# Patient Record
Sex: Male | Born: 1960
Health system: Southern US, Community
[De-identification: ages and names within clinical notes are randomized; demographics above are authoritative.]

## PROBLEM LIST (undated history)

## (undated) DIAGNOSIS — N400 Enlarged prostate without lower urinary tract symptoms: Secondary | ICD-10-CM

## (undated) DIAGNOSIS — G4730 Sleep apnea, unspecified: Secondary | ICD-10-CM

## (undated) DIAGNOSIS — Z87442 Personal history of urinary calculi: Secondary | ICD-10-CM

## (undated) DIAGNOSIS — E785 Hyperlipidemia, unspecified: Secondary | ICD-10-CM

## (undated) DIAGNOSIS — A048 Other specified bacterial intestinal infections: Secondary | ICD-10-CM

## (undated) DIAGNOSIS — I10 Essential (primary) hypertension: Secondary | ICD-10-CM

## (undated) HISTORY — DX: Benign prostatic hyperplasia without lower urinary tract symptoms: N40.0

## (undated) HISTORY — PX: OTHER SURGICAL HISTORY: SHX169

## (undated) HISTORY — DX: Other specified bacterial intestinal infections: A04.8

## (undated) HISTORY — DX: Hyperlipidemia, unspecified: E78.5

## (undated) HISTORY — DX: Essential (primary) hypertension: I10

---

## 2002-08-25 HISTORY — PX: OTHER SURGICAL HISTORY: SHX169

## 2005-07-07 ENCOUNTER — Ambulatory Visit: Payer: Self-pay | Admitting: Family Medicine

## 2009-09-18 ENCOUNTER — Emergency Department (HOSPITAL_COMMUNITY): Admission: EM | Admit: 2009-09-18 | Discharge: 2009-09-18 | Payer: Self-pay | Admitting: Emergency Medicine

## 2010-11-11 LAB — BASIC METABOLIC PANEL
BUN: 11 mg/dL (ref 6–23)
CO2: 31 mEq/L (ref 19–32)
Calcium: 10.4 mg/dL (ref 8.4–10.5)
GFR calc non Af Amer: >60 mL/min (ref >60)
Glucose, Bld: 501 mg/dL — ABNORMAL HIGH (ref 70–99)
Sodium: 131 mEq/L — ABNORMAL LOW (ref 135–145)

## 2010-11-11 LAB — DIFFERENTIAL
Lymphs Abs: 2 10*3/uL (ref 0.7–4.0)
Monocytes Absolute: 0.5 10*3/uL (ref 0.1–1.0)
Monocytes Relative: 8 % (ref 3–12)
Neutro Abs: 3.1 10*3/uL (ref 1.7–7.7)
Neutrophils Relative %: 54 % (ref 43–77)

## 2010-11-11 LAB — HEPATIC FUNCTION PANEL
Alkaline Phosphatase: 118 U/L — ABNORMAL HIGH (ref 39–117)
Indirect Bilirubin: 0.7 mg/dL (ref 0.3–0.9)
Total Bilirubin: 0.8 mg/dL (ref 0.3–1.2)
Total Protein: 6.7 g/dL (ref 6.0–8.3)

## 2010-11-11 LAB — CBC
MCHC: 33.3 g/dL (ref 30.0–36.0)
RBC: 5.04 MIL/uL (ref 4.22–5.81)
WBC: 5.9 10*3/uL (ref 4.0–10.5)

## 2010-11-11 LAB — GLUCOSE, CAPILLARY

## 2010-11-11 LAB — POCT CARDIAC MARKERS: Myoglobin, poc: 74.3 ng/mL (ref 12–200)

## 2011-11-10 ENCOUNTER — Ambulatory Visit: Payer: Self-pay | Admitting: Family Medicine

## 2011-11-11 ENCOUNTER — Encounter: Payer: Self-pay | Admitting: Family Medicine

## 2011-11-11 ENCOUNTER — Ambulatory Visit (INDEPENDENT_AMBULATORY_CARE_PROVIDER_SITE_OTHER): Payer: BC Managed Care – PPO | Admitting: Family Medicine

## 2011-11-11 VITALS — BP 130/90 | HR 83 | Resp 16 | Ht 68.0 in | Wt 236.0 lb

## 2011-11-11 DIAGNOSIS — R3911 Hesitancy of micturition: Secondary | ICD-10-CM

## 2011-11-11 DIAGNOSIS — E785 Hyperlipidemia, unspecified: Secondary | ICD-10-CM | POA: Insufficient documentation

## 2011-11-11 DIAGNOSIS — E669 Obesity, unspecified: Secondary | ICD-10-CM

## 2011-11-11 DIAGNOSIS — E1165 Type 2 diabetes mellitus with hyperglycemia: Secondary | ICD-10-CM | POA: Insufficient documentation

## 2011-11-11 DIAGNOSIS — E119 Type 2 diabetes mellitus without complications: Secondary | ICD-10-CM

## 2011-11-11 DIAGNOSIS — B351 Tinea unguium: Secondary | ICD-10-CM

## 2011-11-11 DIAGNOSIS — Z125 Encounter for screening for malignant neoplasm of prostate: Secondary | ICD-10-CM

## 2011-11-11 LAB — LIPID PANEL
HDL: 29 mg/dL — ABNORMAL LOW (ref >39)
LDL Cholesterol: 143 mg/dL — ABNORMAL HIGH (ref 0–99)
Total CHOL/HDL Ratio: 7 Ratio
Triglycerides: 153 mg/dL — ABNORMAL HIGH (ref <150)
VLDL: 31 mg/dL (ref 0–40)

## 2011-11-11 LAB — GLUCOSE, POCT (MANUAL RESULT ENTRY): POC Glucose: 277

## 2011-11-11 LAB — COMPREHENSIVE METABOLIC PANEL
ALT: 29 U/L (ref 0–53)
AST: 17 U/L (ref 0–37)
Albumin: 4 g/dL (ref 3.5–5.2)
BUN: 11 mg/dL (ref 6–23)
Calcium: 10.6 mg/dL — ABNORMAL HIGH (ref 8.4–10.5)
Chloride: 99 mEq/L (ref 96–112)
Potassium: 4.6 mEq/L (ref 3.5–5.3)
Sodium: 133 mEq/L — ABNORMAL LOW (ref 135–145)
Total Protein: 6.9 g/dL (ref 6.0–8.3)

## 2011-11-11 LAB — CBC
Hemoglobin: 14.7 g/dL (ref 13.0–17.0)
MCH: 28.4 pg (ref 26.0–34.0)
MCHC: 32.4 g/dL (ref 30.0–36.0)
Platelets: 211 10*3/uL (ref 150–400)
RDW: 13 % (ref 11.5–15.5)

## 2011-11-11 LAB — HEMOGLOBIN A1C: Mean Plasma Glucose: 318 mg/dL — ABNORMAL HIGH (ref <117)

## 2011-11-11 NOTE — Patient Instructions (Addendum)
Get the labs done, we will call with results and how much diabetes medication you need  For your diabetes your goal for blood pressure is top number less than 130 and bottom number less than 80 Your "bad" cholesterol or LDL goal is less than 100  Test your blood sugar fasting  And record  I will start medications depending on your lab results It is important to watch your intake on the road, avoid french fries, hamburgers, fried foods, soda, sweet tea  F/U 1 month

## 2011-11-11 NOTE — Progress Notes (Signed)
  Subjective:    [name], male    DOB: 26-Apr-1961, 51 y.o.   MRN: [id number]  HPI  Pt here to establish care previous PCP [facility]  DM- history of DM, he was on Metformin but was not taking care of himself properly, he recently restarted expired Metforim 500mg  tablets he found at home. He was on both Metformin and Glipizide in the past.    Hyperlipidemia- no current medications, told it was high in the past  Truck driver    Urinary hesistancy-He has difficulty with his urine stream at times, he feels the need to go and dribbles at time, others he does not empy his bladder all the way, denies dysuria   Nail fungus- he did not complete the course of medications for his nails and is asking for this to be filled  Review of Systems  GEN- denies fatigue, fever, weight loss,weakness, recent illness HEENT- denies eye drainage, change in vision, nasal discharge, CVS- denies chest pain, palpitations RESP- denies SOB, cough, wheeze ABD- denies N/V, change in stools, abd pain GU- denies dysuria, hematuria,+ dribbling, incontinence MSK- denies joint pain, muscle aches, injury Neuro- denies headache, dizziness, syncope, seizure activity       Objective:   Physical Exam GEN- NAD, alert and oriented x3 HEENT- PERRL, EOMI, non injected sclera, pink conjunctiva, MMM, oropharynx clear- poor dentition Neck- Supple, no bruit CVS- RRR, no murmur RESP-CTAB ABD-NABS,soft, NT,ND EXT- trace left pedal edema Pulses- Radial, DP- 2+ Nails- +fungus  bilateral great toes and 1st digits, thick nails       Assessment & Plan:

## 2011-11-12 MED ORDER — METFORMIN HCL 1000 MG PO TABS: 1000.0000 mg | ORAL_TABLET | Freq: Two times a day (BID) | ORAL | Status: DC

## 2011-11-12 MED ORDER — TAMSULOSIN HCL 0.4 MG PO CAPS: 0.4000 mg | ORAL_CAPSULE | Freq: Every day | ORAL | Status: DC

## 2011-11-12 MED ORDER — TERBINAFINE HCL 250 MG PO TABS: 250.0000 mg | ORAL_TABLET | Freq: Every day | ORAL | Status: AC

## 2011-11-12 NOTE — Assessment & Plan Note (Signed)
Will give coures of lamisil

## 2011-11-12 NOTE — Assessment & Plan Note (Addendum)
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 He was given diabetic food manuals for the road at the visit.  Will restart Metformin and titrate up to 1000mg  bid  Will start low dose ACEI at next visit

## 2011-11-12 NOTE — Assessment & Plan Note (Signed)
FLP checked and also severely elevated, will start DM treatment then get him on medications for cholesterol

## 2011-11-12 NOTE — Assessment & Plan Note (Signed)
Check PSA, will give trial of flomax

## 2011-12-15 ENCOUNTER — Other Ambulatory Visit: Payer: Self-pay | Admitting: Family Medicine

## 2011-12-15 ENCOUNTER — Encounter: Payer: Self-pay | Admitting: Family Medicine

## 2011-12-15 ENCOUNTER — Ambulatory Visit (INDEPENDENT_AMBULATORY_CARE_PROVIDER_SITE_OTHER): Payer: BC Managed Care – PPO | Admitting: Family Medicine

## 2011-12-15 VITALS — BP 148/72 | HR 90 | Resp 18 | Ht 68.0 in | Wt 234.1 lb

## 2011-12-15 DIAGNOSIS — Z1211 Encounter for screening for malignant neoplasm of colon: Secondary | ICD-10-CM

## 2011-12-15 DIAGNOSIS — I10 Essential (primary) hypertension: Secondary | ICD-10-CM

## 2011-12-15 DIAGNOSIS — E119 Type 2 diabetes mellitus without complications: Secondary | ICD-10-CM

## 2011-12-15 DIAGNOSIS — R3911 Hesitancy of micturition: Secondary | ICD-10-CM

## 2011-12-15 DIAGNOSIS — E785 Hyperlipidemia, unspecified: Secondary | ICD-10-CM

## 2011-12-15 DIAGNOSIS — K5900 Constipation, unspecified: Secondary | ICD-10-CM

## 2011-12-15 MED ORDER — GLIPIZIDE 5 MG PO TABS: 5.0000 mg | ORAL_TABLET | Freq: Two times a day (BID) | ORAL | Status: DC

## 2011-12-15 MED ORDER — LISINOPRIL 10 MG PO TABS: 10.0000 mg | ORAL_TABLET | Freq: Every day | ORAL | Status: DC

## 2011-12-15 NOTE — Patient Instructions (Signed)
Hold the flomax pill x 1 week, if the bowels become constipated again then I will change it  Stool softener twice a day  Take the blood pressure pill once a day  I will refer you to gastroenterology  I will refer you to a nutritionist  F/U 3 months

## 2011-12-16 ENCOUNTER — Encounter: Payer: Self-pay | Admitting: Family Medicine

## 2011-12-16 DIAGNOSIS — K5900 Constipation, unspecified: Secondary | ICD-10-CM | POA: Insufficient documentation

## 2011-12-16 DIAGNOSIS — I10 Essential (primary) hypertension: Secondary | ICD-10-CM | POA: Insufficient documentation

## 2011-12-16 NOTE — Progress Notes (Signed)
  Subjective:    [name], male    DOB: 08/30/1960, 51 y.o.   MRN: [id number]  HPI  Patient here to followup diabetes and medications. He is out of test strips. His fasting have been 200-300. He's now taking metformin one tablet twice a day. Constipation-he has history of constipation in the past. He fell after starting the prostate pill that it may have gotten worse although it did help his prostate symptoms a lot. He denies any diarrhea. Denies any recent blood in the stool but has been straining. Labs reviewed   Review of Systems    GEN- denies fatigue, fever, weight loss,weakness, recent illness HEENT- denies eye drainage, change in vision, nasal discharge, CVS- denies chest pain, palpitations RESP- denies SOB, cough, wheeze ABD- denies N/V, +change in stools, abd pain GU- denies dysuria, hematuria,+ dribbling, incontinence MSK- denies joint pain, muscle aches, injury Neuro- denies headache, dizziness, syncope, seizure activity    Objective:   Physical Exam GEN- NAD, alert and oriented x3 CVS- RRR, no murmur RESP-CTAB ABD-NABS,soft, NT,ND EXT- trace left pedal edema Rectum-soft, brown stool, FOBT neg, small internal hemorroid palpated no prolapse Pulses- Radial, DP- 2+        Assessment & Plan:

## 2011-12-16 NOTE — Assessment & Plan Note (Signed)
Fasting blood sugars still quite elevated. We'll add glipizide twice a day

## 2011-12-16 NOTE — Assessment & Plan Note (Signed)
Start lisinopril daily

## 2011-12-16 NOTE — Assessment & Plan Note (Signed)
Will start him on stool softeners twice a day. I will also refer him for colonoscopy I believe he has internal hemorrhoids.

## 2011-12-16 NOTE — Assessment & Plan Note (Signed)
PSA wnl, will hold on meds at this tme to see if contributing to constipation if so will change to Alpha blocker

## 2011-12-16 NOTE — Assessment & Plan Note (Signed)
LDL above goal. Patient to work on diet I will refer him to nutritionist. We will concentrate on the diabetes and hypertension first

## 2011-12-18 ENCOUNTER — Telehealth (HOSPITAL_COMMUNITY): Payer: Self-pay | Admitting: Dietician

## 2011-12-18 NOTE — Telephone Encounter (Signed)
Received referral from Dr. Jeanice Lim for dx: diabetes obesity on 12/16/11.

## 2011-12-18 NOTE — Telephone Encounter (Signed)
Sent letter to pt home via US Mail in attempt to contact pt to schedule appointment.  

## 2011-12-19 ENCOUNTER — Encounter: Payer: Self-pay | Admitting: Urgent Care

## 2011-12-19 ENCOUNTER — Ambulatory Visit (INDEPENDENT_AMBULATORY_CARE_PROVIDER_SITE_OTHER): Payer: BC Managed Care – PPO | Admitting: Urgent Care

## 2011-12-19 DIAGNOSIS — K219 Gastro-esophageal reflux disease without esophagitis: Secondary | ICD-10-CM | POA: Insufficient documentation

## 2011-12-19 DIAGNOSIS — K5900 Constipation, unspecified: Secondary | ICD-10-CM

## 2011-12-19 MED ORDER — OMEPRAZOLE 20 MG PO CPDR: 20.0000 mg | DELAYED_RELEASE_CAPSULE | Freq: Every day | ORAL | Status: DC

## 2011-12-19 NOTE — Patient Instructions (Addendum)
You need a colonoscopy and EGD (upper endoscopy) with Dr. Jena Gauss Begin omeprazole 20 mg daily 30 minutes before breakfast Begin MiraLax 17 g daily as needed for constipation Hold Glucotrol night before and morning of your procedure Hold metformin morning of your procedure Constipation in Adults Constipation is having fewer than 2 bowel movements per week. Usually, the stools are hard. As we grow older, constipation is more common. If you try to fix constipation with laxatives, the problem may get worse. This is because laxatives taken over a long period of time make the colon muscles weaker. A low-fiber diet, not taking in enough fluids, and taking some medicines may make these problems worse. MEDICATIONS THAT MAY CAUSE CONSTIPATION  Water pills (diuretics).   Calcium channel blockers (used to control blood pressure and for the heart).   Certain pain medicines (narcotics).   Anticholinergics.   Anti-inflammatory agents.   Antacids that contain aluminum.  DISEASES THAT CONTRIBUTE TO CONSTIPATION  Diabetes.   Parkinson's disease.   Dementia.   Stroke.   Depression.   Illnesses that cause problems with salt and water metabolism.  HOME CARE INSTRUCTIONS   Constipation is usually best cared for without medicines. Increasing dietary fiber and eating more fruits and vegetables is the best way to manage constipation.   Slowly increase fiber intake to 25 to 38 grams per day. Whole grains, fruits, vegetables, and legumes are good sources of fiber. A dietitian can further help you incorporate high-fiber foods into your diet.   Drink enough water and fluids to keep your urine clear or pale yellow.   A fiber supplement may be added to your diet if you cannot get enough fiber from foods.   Increasing your activities also helps improve regularity.   Suppositories, as suggested by your caregiver, will also help. If you are using antacids, such as aluminum or calcium containing products,  it will be helpful to switch to products containing magnesium if your caregiver says it is okay.   If you have been given a liquid injection (enema) today, this is only a temporary measure. It should not be relied on for treatment of longstanding (chronic) constipation.   Stronger measures, such as magnesium sulfate, should be avoided if possible. This may cause uncontrollable diarrhea. Using magnesium sulfate may not allow you time to make it to the bathroom.  SEEK IMMEDIATE MEDICAL CARE IF:   There is bright red blood in the stool.   The constipation stays for more than 4 days.   There is belly (abdominal) or rectal pain.   You do not seem to be getting better.   You have any questions or concerns.  MAKE SURE YOU:   Understand these instructions.   Will watch your condition.   Will get help right away if you are not doing well or get worse.  Document Released: 05/09/2004 Document Revised: 07/31/2011 Document Reviewed: 07/15/2011 [facility] Patient Information 2012 [place], [place]. Diet for GERD or PUD Nutrition therapy can help ease the discomfort of gastroesophageal reflux disease (GERD) and peptic ulcer disease (PUD).  HOME CARE INSTRUCTIONS   Eat your meals slowly, in a relaxed setting.   Eat 5 to 6 small meals per day.   If a food causes distress, stop eating it for a period of time.  FOODS TO AVOID  Coffee, regular or decaffeinated.   Cola beverages, regular or low calorie.   Tea, regular or decaffeinated.   Pepper.   Cocoa.   High fat foods, including meats.  Butter, margarine, hydrogenated oil (trans fats).   Peppermint or spearmint (if you have GERD).   Fruits and vegetables if not tolerated.   Alcohol.   Nicotine (smoking or chewing). This is one of the most potent stimulants to acid production in the gastrointestinal tract.   Any food that seems to aggravate your condition.  If you have questions regarding your diet, ask your caregiver or a  registered dietitian. TIPS  Lying flat may make symptoms worse. Keep the head of your bed raised 6 to 9 inches (15 to 23 cm) by using a foam wedge or blocks under the legs of the bed.   Do not lay down until 3 hours after eating a meal.   Daily physical activity may help reduce symptoms.  MAKE SURE YOU:   Understand these instructions.   Will watch your condition.   Will get help right away if you are not doing well or get worse.  Document Released: 08/11/2005 Document Revised: 07/31/2011 Document Reviewed: 06/27/2011 [facility] Patient Information 2012 [place], [place].

## 2011-12-19 NOTE — Progress Notes (Signed)
Referring Provider: Salley Scarlet, MD Primary Care Physician:  Milinda Antis, MD, MD Primary Gastroenterologist:  Dr. Jena Gauss  Chief Complaint  Patient presents with  . Colonoscopy  . Constipation    HPI:  Scott Perez is a 51 y.o. male here as a referral from Dr. Jeanice Lim for screening colonoscopy. Upon further questioning for triage, it was noted that he has been having problems with constipation and acid reflux. He was brought into the office to discuss this further prior to his procedure. He began to have constipation that started briefly after he started taking  flomax.  Rarely had constipation until started medicine 3 weeks ago.  Denies rectal bleeding or melena.  Weight stable.  Appetite ok.  C/o heartburn & indigestion daily 6 months.  He has not tried any medications yet.  Denies dysphagia or odynophagia.  His acid reflux is worse 1st thing in morning & late at night.  Occasionally awakens him from sleeping.     Past Medical History  Diagnosis Date  . Diabetes mellitus   . Hyperlipidemia   . Hypertension   . BPH (benign prostatic hypertrophy)    Past Surgical History  Procedure Date  . Left foot 2004  . Cyst left wrist     Current Outpatient Prescriptions  Medication Sig Dispense Refill  . glipiZIDE (GLUCOTROL) 5 MG tablet Take 1 tablet (5 mg total) by mouth 2 (two) times daily before a meal.  60 tablet  3  . lisinopril (PRINIVIL,ZESTRIL) 10 MG tablet Take 1 tablet (10 mg total) by mouth daily.  30 tablet  3  . metFORMIN (GLUCOPHAGE) 1000 MG tablet Take 1 tablet (1,000 mg total) by mouth 2 (two) times daily with a meal.  60 tablet  3  . Tamsulosin HCl (FLOMAX) 0.4 MG CAPS Take 1 capsule (0.4 mg total) by mouth daily after breakfast.  30 capsule  3  . terbinafine (LAMISIL) 250 MG tablet Take 1 tablet (250 mg total) by mouth daily.  42 tablet  0    Allergies as of 12/19/2011  . (No Known Allergies)    Family History  Problem Relation Age of Onset  . Breast cancer  Mother   . Hypertension Father   . Colon polyps Brother   . Colon cancer Paternal Uncle     History   Social History  . Marital Status: Married    Spouse Name: N/A    Number of Children: 3  . Years of Education: N/A   Occupational History  . truck driver    Social History Main Topics  . Smoking status: Former Smoker -- 1.0 packs/day for 18 years    Types: Cigarettes    Quit date: 12/19/1995  . Smokeless tobacco: Not on file  . Alcohol Use: No  . Drug Use: No  . Sexually Active: Not on file  Review of Systems: Gen: Denies any fever, chills, sweats, anorexia, fatigue, weakness, malaise, weight loss, and sleep disorder. CV: Denies chest pain, angina, palpitations, syncope, orthopnea, PND, peripheral edema, and claudication. Resp: Denies dyspnea at rest, dyspnea with exercise, cough, sputum, wheezing, coughing up blood, and pleurisy. GI: Denies vomiting blood, jaundice, and fecal incontinence. GU : Denies urinary burning, blood in urine, urinary frequency, urinary hesitancy, nocturnal urination, and urinary incontinence. MS: Denies joint pain, limitation of movement, and swelling, stiffness, low back pain, extremity pain. Denies muscle weakness, cramps, atrophy.  Derm: Denies rash, itching, dry skin, hives, moles, warts, or unhealing ulcers.  Psych: Denies depression, anxiety, memory loss,  suicidal ideation, hallucinations, paranoia, and confusion. Heme: Denies bruising, bleeding, and enlarged lymph nodes. Neuro:  Denies any headaches, dizziness, paresthesias. Endo:  Denies any problems with DM, thyroid, adrenal function.  Physical Exam: BP 131/65  Pulse 66  Temp(Src) 97 F (36.1 C) (Temporal)  Ht 5\' 8"  (1.727 m)  Wt 231 lb 12.8 oz (105.144 kg)  BMI 35.25 kg/m2 General:   Alert,  Well-developed, well-nourished, pleasant and cooperative in NAD. Head:  Normocephalic and atraumatic. Eyes:  Sclera clear, no icterus.   Conjunctiva pink. Ears:  Normal auditory acuity. Nose:   No deformity, discharge, or lesions. Mouth:  No deformity or lesions,oropharynx pink & moist. Neck:  Supple; no masses or thyromegaly. Lungs:  Clear throughout to auscultation.   No wheezes, crackles, or rhonchi. No acute distress. Heart:  Regular rate and rhythm; no murmurs, clicks, rubs,  or gallops. Abdomen:  Normal bowel sounds.  No bruits.  Soft, non-tender and non-distended without masses, hepatosplenomegaly or hernias noted.  No guarding or rebound tenderness.   Rectal:  Deferred. Msk:  Symmetrical without gross deformities. Normal posture. Pulses:  Normal pulses noted. Extremities:  No clubbing or edema. Neurologic:  Alert and oriented x4;  grossly normal neurologically. Skin:  Intact without significant lesions or rashes. Lymph Nodes:  No significant cervical adenopathy. Psych:  Alert and cooperative. Normal mood and affect.

## 2011-12-19 NOTE — Assessment & Plan Note (Addendum)
HIMMAT ENBERG is a pleasant 51 y.o. male with constipation that started 3 weeks ago most likely secondary to new medication flomax.  He is due for surveillance colonoscopy.  I have discussed risks & benefits which include, but are not limited to, bleeding, infection, perforation & drug reaction.  The patient agrees with this plan & written consent will be obtained.    Begin MiraLax 17 g daily as needed for constipation Hold Glucotrol night before and morning of your procedure Hold metformin morning of your procedure Constipation literature

## 2011-12-19 NOTE — Progress Notes (Signed)
Faxed to PCP

## 2011-12-19 NOTE — Progress Notes (Signed)
Addended by: Cherene Julian D on: 12/19/2011 10:25 AM   Modules accepted: Orders

## 2011-12-19 NOTE — Assessment & Plan Note (Signed)
New onset GERD over the course of 6 months is worrisome given his age.  He will need further evaluation to rule out complicated GERD, malignancy, peptic ulcer disease, or gastritis.  I have discussed risks & benefits which include, but are not limited to, bleeding, infection, perforation & drug reaction.  The patient agrees with this plan & written consent will be obtained.    Begin omeprazole 20 mg daily 30 minutes before breakfast GERD literature

## 2011-12-24 ENCOUNTER — Telehealth: Payer: Self-pay

## 2011-12-24 DIAGNOSIS — A048 Other specified bacterial intestinal infections: Secondary | ICD-10-CM

## 2011-12-24 HISTORY — DX: Other specified bacterial intestinal infections: A04.8

## 2011-12-24 NOTE — Telephone Encounter (Signed)
Pt called. 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 York Spaniel he needs to change date of colonoscopy/egd from 12/29/2011 to 12/30/2011. I changed on our schedule and with Jules Schick in Endo. Pt is scheduled for 1:45 PM on 12/30/2011 with [facility], and is aware that he should be at the hospital at 12:45 PM.

## 2011-12-24 NOTE — Telephone Encounter (Signed)
Sent letter to pt home via US Mail in attempt to contact pt to schedule appointment.  

## 2011-12-29 ENCOUNTER — Telehealth: Payer: Self-pay | Admitting: Internal Medicine

## 2011-12-29 MED ORDER — SODIUM CHLORIDE 0.45 % IV SOLN
Freq: Once | INTRAVENOUS | Status: AC
  Administered 2012-01-05: 12:00:00 via INTRAVENOUS

## 2011-12-29 NOTE — Telephone Encounter (Signed)
Called pt back to reschedule- he will check his schedule & call back

## 2011-12-29 NOTE — Telephone Encounter (Signed)
Pt called to [facility] his procedure. Please call him back at [phone]

## 2012-01-01 ENCOUNTER — Encounter (HOSPITAL_COMMUNITY): Payer: Self-pay | Admitting: Pharmacy Technician

## 2012-01-01 NOTE — Telephone Encounter (Signed)
Sent letter to pt home via US Mail in attempt to contact pt to schedule appointment.  

## 2012-01-05 ENCOUNTER — Encounter (HOSPITAL_COMMUNITY)
Admission: RE | Disposition: A | Discharge status: Home Health | Payer: Self-pay | Source: Ambulatory Visit | Attending: Internal Medicine

## 2012-01-05 ENCOUNTER — Encounter (HOSPITAL_COMMUNITY): Payer: Self-pay | Admitting: *Deleted

## 2012-01-05 ENCOUNTER — Ambulatory Visit (HOSPITAL_COMMUNITY)
Admission: RE | Admit: 2012-01-05 | Discharge: 2012-01-05 | Disposition: A | Discharge status: Home Health | Payer: BC Managed Care – PPO | Source: Ambulatory Visit | Attending: Internal Medicine | Admitting: Internal Medicine

## 2012-01-05 DIAGNOSIS — K219 Gastro-esophageal reflux disease without esophagitis: Secondary | ICD-10-CM | POA: Insufficient documentation

## 2012-01-05 DIAGNOSIS — E785 Hyperlipidemia, unspecified: Secondary | ICD-10-CM | POA: Insufficient documentation

## 2012-01-05 DIAGNOSIS — E119 Type 2 diabetes mellitus without complications: Secondary | ICD-10-CM | POA: Insufficient documentation

## 2012-01-05 DIAGNOSIS — K5909 Other constipation: Secondary | ICD-10-CM

## 2012-01-05 DIAGNOSIS — D126 Benign neoplasm of colon, unspecified: Secondary | ICD-10-CM | POA: Insufficient documentation

## 2012-01-05 DIAGNOSIS — Z79899 Other long term (current) drug therapy: Secondary | ICD-10-CM | POA: Insufficient documentation

## 2012-01-05 DIAGNOSIS — I10 Essential (primary) hypertension: Secondary | ICD-10-CM | POA: Insufficient documentation

## 2012-01-05 DIAGNOSIS — Z1211 Encounter for screening for malignant neoplasm of colon: Secondary | ICD-10-CM | POA: Insufficient documentation

## 2012-01-05 DIAGNOSIS — K222 Esophageal obstruction: Secondary | ICD-10-CM | POA: Insufficient documentation

## 2012-01-05 DIAGNOSIS — Z01812 Encounter for preprocedural laboratory examination: Secondary | ICD-10-CM | POA: Insufficient documentation

## 2012-01-05 SURGERY — COLONOSCOPY WITH ESOPHAGOGASTRODUODENOSCOPY (EGD)
Anesthesia: Moderate Sedation

## 2012-01-05 MED ORDER — MIDAZOLAM HCL 5 MG/5ML IJ SOLN
INTRAMUSCULAR | Status: DC | PRN
  Administered 2012-01-05: 1 mg via INTRAVENOUS
  Administered 2012-01-05 (×2): 2 mg via INTRAVENOUS
  Administered 2012-01-05: 1 mg via INTRAVENOUS

## 2012-01-05 MED ORDER — BUTAMBEN-TETRACAINE-BENZOCAINE 2-2-14 % EX AERO
INHALATION_SPRAY | CUTANEOUS | Status: DC | PRN
  Administered 2012-01-05: 2 via TOPICAL

## 2012-01-05 MED ORDER — STERILE WATER FOR IRRIGATION IR SOLN
Status: DC | PRN
  Administered 2012-01-05: 13:00:00

## 2012-01-05 MED ORDER — MEPERIDINE HCL 100 MG/ML IJ SOLN
INTRAMUSCULAR | Status: DC | PRN
  Administered 2012-01-05: 25 mg via INTRAVENOUS
  Administered 2012-01-05: 50 mg via INTRAVENOUS
  Administered 2012-01-05: 25 mg via INTRAVENOUS

## 2012-01-05 MED ORDER — MEPERIDINE HCL 100 MG/ML IJ SOLN
INTRAMUSCULAR | Status: AC
  Filled 2012-01-05: qty 2

## 2012-01-05 MED ORDER — MIDAZOLAM HCL 5 MG/5ML IJ SOLN
INTRAMUSCULAR | Status: AC
  Filled 2012-01-05: qty 10

## 2012-01-05 NOTE — H&P (View-Only) (Signed)
Referring Provider: Mayes, Kawanta F, MD Primary Care Physician:  [place], KAWANTA, MD, MD Primary Gastroenterologist:  Dr. Rourk  Chief Complaint  Patient presents with  . Colonoscopy  . Constipation    HPI:  Scott Perez is a 51 y.o. male here as a referral from Dr. [place] for screening colonoscopy. Upon further questioning for triage, it was noted that he has been having problems with constipation and acid reflux. He was brought into the office to discuss this further prior to his procedure. He began to have constipation that started briefly after he started taking  flomax.  Rarely had constipation until started medicine 3 weeks ago.  Denies rectal bleeding or melena.  Weight stable.  Appetite ok.  C/o heartburn & indigestion daily 6 months.  He has not tried any medications yet.  Denies dysphagia or odynophagia.  His acid reflux is worse 1st thing in morning & late at night.  Occasionally awakens him from sleeping.     Past Medical History  Diagnosis Date  . Diabetes mellitus   . Hyperlipidemia   . Hypertension   . BPH (benign prostatic hypertrophy)    Past Surgical History  Procedure Date  . Left foot 2004  . Cyst left wrist     Current Outpatient Prescriptions  Medication Sig Dispense Refill  . glipiZIDE (GLUCOTROL) 5 MG tablet Take 1 tablet (5 mg total) by mouth 2 (two) times daily before a meal.  60 tablet  3  . lisinopril (PRINIVIL,ZESTRIL) 10 MG tablet Take 1 tablet (10 mg total) by mouth daily.  30 tablet  3  . metFORMIN (GLUCOPHAGE) 1000 MG tablet Take 1 tablet (1,000 mg total) by mouth 2 (two) times daily with a meal.  60 tablet  3  . Tamsulosin HCl (FLOMAX) 0.4 MG CAPS Take 1 capsule (0.4 mg total) by mouth daily after breakfast.  30 capsule  3  . terbinafine (LAMISIL) 250 MG tablet Take 1 tablet (250 mg total) by mouth daily.  42 tablet  0    Allergies as of 12/19/2011  . (No Known Allergies)    Family History  Problem Relation Age of Onset  . Breast cancer  Mother   . Hypertension Father   . Colon polyps Brother   . Colon cancer Paternal Uncle     History   Social History  . Marital Status: Married    Spouse Name: N/A    Number of Children: 3  . Years of Education: N/A   Occupational History  . truck driver    Social History Main Topics  . Smoking status: Former Smoker -- 1.0 packs/day for 18 years    Types: Cigarettes    Quit date: 12/19/1995  . Smokeless tobacco: Not on file  . Alcohol Use: No  . Drug Use: No  . Sexually Active: Not on file  Review of Systems: Gen: Denies any fever, chills, sweats, anorexia, fatigue, weakness, malaise, weight loss, and sleep disorder. CV: Denies chest pain, angina, palpitations, syncope, orthopnea, PND, peripheral edema, and claudication. Resp: Denies dyspnea at rest, dyspnea with exercise, cough, sputum, wheezing, coughing up blood, and pleurisy. GI: Denies vomiting blood, jaundice, and fecal incontinence. GU : Denies urinary burning, blood in urine, urinary frequency, urinary hesitancy, nocturnal urination, and urinary incontinence. MS: Denies joint pain, limitation of movement, and swelling, stiffness, low back pain, extremity pain. Denies muscle weakness, cramps, atrophy.  Derm: Denies rash, itching, dry skin, hives, moles, warts, or unhealing ulcers.  Psych: Denies depression, anxiety, memory loss,   suicidal ideation, hallucinations, paranoia, and confusion. Heme: Denies bruising, bleeding, and enlarged lymph nodes. Neuro:  Denies any headaches, dizziness, paresthesias. Endo:  Denies any problems with DM, thyroid, adrenal function.  Physical Exam: BP 131/65  Pulse 66  Temp(Src) 97 F (36.1 C) (Temporal)  Ht 5' 8" (1.727 m)  Wt 231 lb 12.8 oz (105.144 kg)  BMI 35.25 kg/m2 General:   Alert,  Well-developed, well-nourished, pleasant and cooperative in NAD. Head:  Normocephalic and atraumatic. Eyes:  Sclera clear, no icterus.   Conjunctiva pink. Ears:  Normal auditory acuity. Nose:   No deformity, discharge, or lesions. Mouth:  No deformity or lesions,oropharynx pink & moist. Neck:  Supple; no masses or thyromegaly. Lungs:  Clear throughout to auscultation.   No wheezes, crackles, or rhonchi. No acute distress. Heart:  Regular rate and rhythm; no murmurs, clicks, rubs,  or gallops. Abdomen:  Normal bowel sounds.  No bruits.  Soft, non-tender and non-distended without masses, hepatosplenomegaly or hernias noted.  No guarding or rebound tenderness.   Rectal:  Deferred. Msk:  Symmetrical without gross deformities. Normal posture. Pulses:  Normal pulses noted. Extremities:  No clubbing or edema. Neurologic:  Alert and oriented x4;  grossly normal neurologically. Skin:  Intact without significant lesions or rashes. Lymph Nodes:  No significant cervical adenopathy. Psych:  Alert and cooperative. Normal mood and affect.  

## 2012-01-05 NOTE — Discharge Instructions (Addendum)
Colonoscopy Discharge Instructions  Read the instructions outlined below and refer to this sheet in the next few weeks. These discharge instructions provide you with general information on caring for yourself after you leave the hospital. Your doctor may also give you specific instructions. While your treatment has been planned according to the most current medical practices available, unavoidable complications occasionally occur. If you have any problems or questions after discharge, call Dr. Gala Romney at [phone]. ACTIVITY  You may resume your regular activity, but move at a slower pace for the next 24 hours.   Take frequent rest periods for the next 24 hours.   Walking will help get rid of the air and reduce the bloated feeling in your belly (abdomen).   No driving for 24 hours (because of the medicine (anesthesia) used during the test).    Do not sign any important legal documents or operate any machinery for 24 hours (because of the anesthesia used during the test).  NUTRITION  Drink plenty of fluids.   You may resume your normal diet as instructed by your doctor.   Begin with a light meal and progress to your normal diet. Heavy or fried foods are harder to digest and may make you feel sick to your stomach (nauseated).   Avoid alcoholic beverages for 24 hours or as instructed.  MEDICATIONS  You may resume your normal medications unless your doctor tells you otherwise.  WHAT YOU CAN EXPECT TODAY  Some feelings of bloating in the abdomen.   Passage of more gas than usual.   Spotting of blood in your stool or on the toilet paper.  IF YOU HAD POLYPS REMOVED DURING THE COLONOSCOPY:  No aspirin products for 7 days or as instructed.   No alcohol for 7 days or as instructed.   Eat a soft diet for the next 24 hours.  FINDING OUT THE RESULTS OF YOUR TEST Not all test results are available during your visit. If your test results are not back during the visit, make an appointment  with your caregiver to find out the results. Do not assume everything is normal if you have not heard from your caregiver or the medical facility. It is important for you to follow up on all of your test results.  SEEK IMMEDIATE MEDICAL ATTENTION IF:  You have more than a spotting of blood in your stool.   Your belly is swollen (abdominal distention).   You are nauseated or vomiting.   You have a temperature over 101.  You have abdominal pain or discomfort that is severe or gets worse throughout the day. EGD Discharge instructions Please read the instructions outlined below and refer to this sheet in the next few weeks. These discharge instructions provide you with general information on caring for yourself after you leave the hospital. Your doctor may also give you specific instructions. While your treatment has been planned according to the most current medical practices available, unavoidable complications occasionally occur. If you have any problems or questions after discharge, please call your doctor. ACTIVITY You may resume your regular activity but move at a slower pace for the next 24 hours.  Take frequent rest periods for the next 24 hours.  Walking will help expel (get rid of) the air and reduce the bloated feeling in your abdomen.  No driving for 24 hours (because of the anesthesia (medicine) used during the test).  You may shower.  Do not sign any important legal documents or operate any machinery for 24  hours (because of the anesthesia used during the test).  NUTRITION Drink plenty of fluids.  You may resume your normal diet.  Begin with a light meal and progress to your normal diet.  Avoid alcoholic beverages for 24 hours or as instructed by your caregiver.  MEDICATIONS You may resume your normal medications unless your caregiver tells you otherwise.  WHAT YOU CAN EXPECT TODAY You may experience abdominal discomfort such as a feeling of fullness or "gas" pains.   FOLLOW-UP Your doctor will discuss the results of your test with you.  SEEK IMMEDIATE MEDICAL ATTENTION IF ANY OF THE FOLLOWING OCCUR: Excessive nausea (feeling sick to your stomach) and/or vomiting.  Severe abdominal pain and distention (swelling).  Trouble swallowing.  Temperature over 101 F (37.8 C).  Rectal bleeding or vomiting of blood.      GERD and polyp information provided.  Continue Prilosec 20 mg daily  Use MiraLax 17 g orally at bedtime as needed for constipation  Further recommendations to follow pending review of pathology report  No MRI until clips known to have passed  H. pylori serologies to be drawn today Gastroesophageal Reflux Disease, Adult Gastroesophageal reflux disease (GERD) happens when acid from your stomach flows up into the esophagus. When acid comes in contact with the esophagus, the acid causes soreness (inflammation) in the esophagus. Over time, GERD may create small holes (ulcers) in the lining of the esophagus. CAUSES   Increased body weight. This puts pressure on the stomach, making acid rise from the stomach into the esophagus.   Smoking. This increases acid production in the stomach.   Drinking alcohol. This causes decreased pressure in the lower esophageal sphincter (valve or ring of muscle between the esophagus and stomach), allowing acid from the stomach into the esophagus.   Late evening meals and a full stomach. This increases pressure and acid production in the stomach.   A malformed lower esophageal sphincter.  Sometimes, no cause is found. SYMPTOMS   Burning pain in the lower part of the mid-chest behind the breastbone and in the mid-stomach area. This may occur twice a week or more often.   Trouble swallowing.   Sore throat.   Dry cough.   Asthma-like symptoms including chest tightness, shortness of breath, or wheezing.  DIAGNOSIS  Your caregiver may be able to diagnose GERD based on your symptoms. In some cases,  X-rays and other tests may be done to check for complications or to check the condition of your stomach and esophagus. TREATMENT  Your caregiver may recommend over-the-counter or prescription medicines to help decrease acid production. Ask your caregiver before starting or adding any new medicines.  HOME CARE INSTRUCTIONS   Change the factors that you can control. Ask your caregiver for guidance concerning weight loss, quitting smoking, and alcohol consumption.   Avoid foods and drinks that make your symptoms worse, such as:   Caffeine or alcoholic drinks.   Chocolate.   Peppermint or mint flavorings.   Garlic and onions.   Spicy foods.   Citrus fruits, such as oranges, lemons, or limes.   Tomato-based foods such as sauce, chili, salsa, and pizza.   Fried and fatty foods.   Avoid lying down for the 3 hours prior to your bedtime or prior to taking a nap.   Eat small, frequent meals instead of large meals.   Wear loose-fitting clothing. Do not wear anything tight around your waist that causes pressure on your stomach.   Raise the head of your bed  6 to 8 inches with wood blocks to help you sleep. Extra pillows will not help.   Only take over-the-counter or prescription medicines for pain, discomfort, or fever as directed by your caregiver.   Do not take aspirin, ibuprofen, or other nonsteroidal anti-inflammatory drugs (NSAIDs).  SEEK IMMEDIATE MEDICAL CARE IF:   You have pain in your arms, neck, jaw, teeth, or back.   Your pain increases or changes in intensity or duration.   You develop nausea, vomiting, or sweating (diaphoresis).   You develop shortness of breath, or you faint.   Your vomit is green, yellow, black, or looks like coffee grounds or blood.   Your stool is red, bloody, or black.  These symptoms could be signs of other problems, such as heart disease, gastric bleeding, or esophageal bleeding. MAKE SURE YOU:   Understand these instructions.   Will watch  your condition.   Will get help right away if you are not doing well or get worse.  Document Released: 05/21/2005 Document Revised: 07/31/2011 Document Reviewed: 02/28/2011 [facility] Patient Information 2012 [place], [place].  Colon Polyps A polyp is extra tissue that grows inside your body. Colon polyps grow in the large intestine. The large intestine, also called the colon, is part of your digestive system. It is a long, hollow tube at the end of your digestive tract where your body makes and stores stool. Most polyps are not dangerous. They are benign. This means they are not cancerous. But over time, some types of polyps can turn into cancer. Polyps that are smaller than a pea are usually not harmful. But larger polyps could someday become or may already be cancerous. To be safe, doctors remove all polyps and test them.  WHO GETS POLYPS? Anyone can get polyps, but certain people are more likely than others. You may have a greater chance of getting polyps if:  You are over 50.   You have had polyps before.   Someone in your family has had polyps.   Someone in your family has had cancer of the large intestine.   Find out if someone in your family has had polyps. You may also be more likely to get polyps if you:   Eat a lot of fatty foods.   Smoke.   Drink alcohol.   Do not exercise.   Eat too much.  SYMPTOMS  Most small polyps do not cause symptoms. People often do not know they have one until their caregiver finds it during a regular checkup or while testing them for something else. Some people do have symptoms like these:  Bleeding from the anus. You might notice blood on your underwear or on toilet paper after you have had a bowel movement.   Constipation or diarrhea that lasts more than a week.   Blood in the stool. Blood can make stool look black or it can show up as red streaks in the stool.  If you have any of these symptoms, see your caregiver. HOW DOES THE DOCTOR  TEST FOR POLYPS? The doctor can use four tests to check for polyps:  Digital rectal exam. The caregiver wears gloves and checks your rectum (the last part of the large intestine) to see if it feels normal. This test would find polyps only in the rectum. Your caregiver may need to do one of the other tests listed below to find polyps higher up in the intestine.   Barium enema. The caregiver puts a liquid called barium into your rectum before  taking x-rays of your large intestine. Barium makes your intestine look white in the pictures. Polyps are dark, so they are easy to see.   Sigmoidoscopy. With this test, the caregiver can see inside your large intestine. A thin flexible tube is placed into your rectum. The device is called a sigmoidoscope, which has a light and a tiny video camera in it. The caregiver uses the sigmoidoscope to look at the last third of your large intestine.   Colonoscopy. This test is like sigmoidoscopy, but the caregiver looks at all of the large intestine. It usually requires sedation. This is the most common method for finding and removing polyps.  TREATMENT   The caregiver will remove the polyp during sigmoidoscopy or colonoscopy. The polyp is then tested for cancer.   If you have had polyps, your caregiver may want you to get tested regularly in the future.  PREVENTION  There is not one sure way to prevent polyps. You might be able to lower your risk of getting them if you:  Eat more fruits and vegetables and less fatty food.   Do not smoke.   Avoid alcohol.   Exercise every day.   Lose weight if you are overweight.   Eating more calcium and folate can also lower your risk of getting polyps. Some foods that are rich in calcium are milk, cheese, and broccoli. Some foods that are rich in folate are chickpeas, kidney beans, and spinach.   Aspirin might help prevent polyps. Studies are under way.  Document Released: 05/07/2004 Document Revised: 07/31/2011 Document  Reviewed: 10/13/2007 [facility] Patient Information 2012 [place], [place].

## 2012-01-05 NOTE — Op Note (Signed)
[facility] [address] [place], [place]  [address]  ENDOSCOPY PROCEDURE REPORT  PATIENT:  Scott Perez, Scott Perez  MR#:  [id number] BIRTHDATE:  1961-07-20, 50 yrs. old  GENDER:  male  ENDOSCOPIST:  R. Roetta Sessions, MD Caleen Essex Referred by:  Milinda Antis, M.D.  PROCEDURE DATE:  01/05/2012 PROCEDURE:  Diagnostic EGD  INDICATIONS:   relatively recent onset somewhat refractory GERD  INFORMED CONSENT:   The risks, benefits, limitations, alternatives and imponderables have been discussed.  The potential for biopsy, esophogeal dilation, etc. have also been reviewed.  Questions have been answered.  All parties agreeable.  Please see the history and physical in the medical record for more information.  MEDICATIONS: Versed 3 mg IV and Demerol 75 mg IV in divided doses. Cetacaine spray.  DESCRIPTION OF PROCEDURE:   The EG-[id number] ([id number]) endoscope was introduced through the mouth and advanced to the second portion of the duodenum without difficulty or limitations.  The mucosal surfaces were surveyed very carefully during advancement of the scope and upon withdrawal.  Retroflexion view of the proximal stomach and esophagogastric junction was performed.  <<PROCEDUREIMAGES>>  FINDINGS:  Incomplete noncritical Schatzki's ring; otherwise, normal esophagus. Stomach empty. Small hiatal hernia. Normal gastric     mucosa. Patent pylorus.  Prominent bulbar erosions; otherwise D1 and D2.  THERAPEUTIC / DIAGNOSTIC MANEUVERS PERFORMED:  None  COMPLICATIONS:   None  IMPRESSION:    Normal esophagus aside from a noncritical incomplete Schatzki's ring. Hiatal hernia. Bulbar erosions of uncertain significance.  RECOMMENDATIONS:  Check H. pylori serologies. See colonoscopy report  ______________________________ R. Roetta Sessions, MD Caleen Essex  CC:  n. eSIGNED:   R. Roetta Sessions at 01/05/2012 12:52 PM  Alene Mires, [phone]

## 2012-01-05 NOTE — Op Note (Signed)
[facility] [address] [place], [place]  [address]  COLONOSCOPY PROCEDURE REPORT  PATIENT:  Scott Perez, Scott Perez  MR#:  [id number] BIRTHDATE:  [birth year], 50 yrs. old  GENDER:  male ENDOSCOPIST:  R. Roetta Sessions, MD FACP [facility] REF. BY:  Milinda Antis, M.D. PROCEDURE DATE:  01/05/2012 PROCEDURE:  Colonoscopy with snare polypectomy and resolution clip placement  INDICATIONS:  First-ever average risk screening colonoscopy; chronic constipation  INFORMED CONSENT:  The risks, benefits, alternatives and imponderables including but not limited to bleeding, perforation as well as the possibility of a missed lesion have been reviewed. The potential for biopsy, lesion removal, etc. have also been discussed.  Questions have been answered.  All parties agreeable. Please see the history and physical in the medical record for more information.  MEDICATIONS:  Versed 6 mg IV and Demerol 100 mg IV in divided doses.  DESCRIPTION OF PROCEDURE:  After a digital rectal exam was performed, the EC-[id number] ([id number]) colonoscope was advanced from the anus through the rectum and colon to the area of the cecum, ileocecal valve and appendiceal orifice.  The cecum was deeply intubated.  These structures were well-seen and photographed for the record.  From the level of the cecum and ileocecal valve, the scope was slowly and cautiously withdrawn.  The mucosal surfaces were carefully surveyed utilizing scope tip deflection to facilitate fold flattening as needed.  The scope was pulled down into the rectum where a thorough examination including retroflexion was performed. <<PROCEDUREIMAGES>>  FINDINGS: Suboptimal preparation. Long redundant colon. Normal rectum. 9 mm pedunculated polyp in the mid descending segment; otherwise remainder of the colonic mucosa appeared normal. Please note a number of maneuvers including changing of the patient's position and external abdominal pressure required to  reach the cecum.  THERAPEUTIC / DIAGNOSTIC MANEUVERS PERFORMED: The descending colon polyp was cold snare removed (intention was hot snare removal). We had Erbie malfunction. This resulted in basically a cold snare polypectomy; there was some bleeding which was easily remedied with placement of 2 resolution hips.  COMPLICATIONS:  None  CECAL WITHDRAWAL TIME: 16 minutes  IMPRESSION: Colonic polyp-removed as described above. Long redundant colon.  RECOMMENDATIONS: Followup on pathology. No MRI until clips known to have passed. See EGD report  ______________________________ R. Roetta Sessions, MD Caleen Essex  CC:  Milinda Antis, M.D.  n. eSIGNED:   R. Roetta Sessions at 01/05/2012 01:38 PM  Alene Mires, [phone]

## 2012-01-05 NOTE — Interval H&P Note (Signed)
History and Physical Interval Note:  01/05/2012 12:35 PM  Scott Perez  has presented today for surgery, with the diagnosis of Constipation, GERD  The various methods of treatment have been discussed with the patient and family. After consideration of risks, benefits and other options for treatment, the patient has consented to  Procedure(s) (LRB): COLONOSCOPY WITH ESOPHAGOGASTRODUODENOSCOPY (EGD) (N/A) as a surgical intervention .  The patients' history has been reviewed, patient examined, no change in status, stable for surgery.  I have reviewed the patients' chart and labs.  Questions were answered to the patient's satisfaction.     [name]

## 2012-01-07 NOTE — Telephone Encounter (Signed)
Sent letter to pt home via US Mail in attempt to contact pt to schedule appointment.  

## 2012-01-08 ENCOUNTER — Encounter: Payer: Self-pay | Admitting: Internal Medicine

## 2012-01-09 ENCOUNTER — Other Ambulatory Visit: Payer: Self-pay

## 2012-01-09 MED ORDER — TAMSULOSIN HCL 0.4 MG PO CAPS: 0.4000 mg | ORAL_CAPSULE | Freq: Every day | ORAL | Status: DC

## 2012-01-09 MED ORDER — METFORMIN HCL 1000 MG PO TABS: 1000.0000 mg | ORAL_TABLET | Freq: Two times a day (BID) | ORAL | Status: DC

## 2012-01-10 ENCOUNTER — Encounter: Payer: Self-pay | Admitting: Internal Medicine

## 2012-01-10 NOTE — Progress Notes (Signed)
[name], male   DOB: [birth year], 51 y.o.   MRN: [id number] HP serologies positive - not yet addressed; needs prevpak or generic equivalent x 14 days with no refills

## 2012-01-13 NOTE — Progress Notes (Signed)
Pt aware, rx called to [facility] 

## 2012-02-05 ENCOUNTER — Telehealth: Payer: Self-pay | Admitting: Family Medicine

## 2012-02-05 MED ORDER — GLIPIZIDE 5 MG PO TABS: 5.0000 mg | ORAL_TABLET | Freq: Two times a day (BID) | ORAL | Status: DC

## 2012-02-05 NOTE — Telephone Encounter (Signed)
Med sent in to [facility]

## 2012-03-04 ENCOUNTER — Ambulatory Visit (INDEPENDENT_AMBULATORY_CARE_PROVIDER_SITE_OTHER): Payer: BC Managed Care – PPO | Admitting: Family Medicine

## 2012-03-04 ENCOUNTER — Encounter: Payer: Self-pay | Admitting: Family Medicine

## 2012-03-04 VITALS — BP 120/80 | HR 87 | Resp 16 | Ht 68.0 in | Wt 240.0 lb

## 2012-03-04 DIAGNOSIS — E785 Hyperlipidemia, unspecified: Secondary | ICD-10-CM

## 2012-03-04 DIAGNOSIS — E669 Obesity, unspecified: Secondary | ICD-10-CM

## 2012-03-04 DIAGNOSIS — I10 Essential (primary) hypertension: Secondary | ICD-10-CM

## 2012-03-04 DIAGNOSIS — E119 Type 2 diabetes mellitus without complications: Secondary | ICD-10-CM

## 2012-03-04 NOTE — Assessment & Plan Note (Signed)
Fasting CBG much improved, in office today CBG 160, check A1C, and titrate medications

## 2012-03-04 NOTE — Assessment & Plan Note (Signed)
Well controlled, no change to meds 

## 2012-03-04 NOTE — Assessment & Plan Note (Signed)
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

## 2012-03-04 NOTE — Progress Notes (Signed)
  Subjective:    [name], male    DOB: 1961-03-14, 51 y.o.   MRN: [id number]  HPI Patient presents to follow chronic medical problems. Medications and history reviewed. He is status post colonoscopy Diabetes mellitus his blood sugars have been running less than 150 fasting. He's only had 2 episodes where there were greater than 200. He is taking glipizide twice a day as well as metformin twice a day. He denies polyuria, polydipsia denies hypoglycemia. HTN- tolerating BP meds, no concerns   Review of Systems  GEN- denies fatigue, fever, weight loss,weakness, recent illness HEENT- denies eye drainage, change in vision, nasal discharge, CVS- denies chest pain, palpitations RESP- denies SOB, cough, wheeze ABD- denies N/V, change in stools, abd pain GU- denies dysuria, hematuria, dribbling, incontinence MSK- denies joint pain, muscle aches, injury Neuro- denies headache, dizziness, syncope, seizure activity      Objective:   Physical Exam  GEN- NAD, alert and oriented x3 HEENT- PERRL, EOMI, non injected sclera, pink conjunctiva, MMM, oropharynx clear- poor dentition Neck- Supple, no bruit CVS- RRR, no murmur RESP-CTAB EXT- trace left pedal edema Pulses- Radial, DP- 2+      Assessment & Plan:

## 2012-03-04 NOTE — Assessment & Plan Note (Signed)
Recheck LDL, he has changed some things about his diet but has gained weight, goal LDL , 100 if elevated will start statin therapy

## 2012-03-04 NOTE — Patient Instructions (Signed)
Continue current medication I will call with lab results Continue to watch the soda and " white foods" Bread, pasta, potatoes, french fries  F/U 3 months

## 2012-03-05 ENCOUNTER — Encounter: Payer: Self-pay | Admitting: Family Medicine

## 2012-03-05 LAB — BASIC METABOLIC PANEL
CO2: 29 mEq/L (ref 19–32)
Calcium: 11 mg/dL — ABNORMAL HIGH (ref 8.4–10.5)
Sodium: 140 mEq/L (ref 135–145)

## 2012-03-05 LAB — LIPID PANEL
HDL: 32 mg/dL — ABNORMAL LOW (ref >39)
LDL Cholesterol: 112 mg/dL — ABNORMAL HIGH (ref 0–99)
Total CHOL/HDL Ratio: 5.8 Ratio

## 2012-03-05 LAB — HEMOGLOBIN A1C: Hgb A1c MFr Bld: 7.5 % — ABNORMAL HIGH (ref <5.7)

## 2012-03-05 MED ORDER — GLIPIZIDE 5 MG PO TABS: 5.0000 mg | ORAL_TABLET | Freq: Two times a day (BID) | ORAL | Status: DC

## 2012-03-05 NOTE — Addendum Note (Signed)
Addended by: Milinda Antis F on: 03/05/2012 10:13 AM   Modules accepted: Orders

## 2012-07-23 ENCOUNTER — Other Ambulatory Visit: Payer: Self-pay | Admitting: Family Medicine

## 2012-07-26 ENCOUNTER — Other Ambulatory Visit: Payer: Self-pay

## 2012-07-26 MED ORDER — TAMSULOSIN HCL 0.4 MG PO CAPS: 0.4000 mg | ORAL_CAPSULE | Freq: Every day | ORAL | Status: DC

## 2012-09-24 ENCOUNTER — Other Ambulatory Visit: Payer: Self-pay | Admitting: Family Medicine

## 2012-10-25 ENCOUNTER — Ambulatory Visit: Payer: BC Managed Care – PPO | Admitting: Family Medicine

## 2012-11-03 ENCOUNTER — Other Ambulatory Visit: Payer: Self-pay | Admitting: Family Medicine

## 2012-11-19 ENCOUNTER — Encounter: Payer: Self-pay | Admitting: Family Medicine

## 2012-11-19 ENCOUNTER — Ambulatory Visit (INDEPENDENT_AMBULATORY_CARE_PROVIDER_SITE_OTHER): Payer: BC Managed Care – PPO | Admitting: Family Medicine

## 2012-11-19 VITALS — BP 140/82 | HR 80 | Resp 18 | Ht 68.0 in | Wt 240.0 lb

## 2012-11-19 DIAGNOSIS — E785 Hyperlipidemia, unspecified: Secondary | ICD-10-CM

## 2012-11-19 DIAGNOSIS — I10 Essential (primary) hypertension: Secondary | ICD-10-CM

## 2012-11-19 DIAGNOSIS — E669 Obesity, unspecified: Secondary | ICD-10-CM

## 2012-11-19 DIAGNOSIS — E119 Type 2 diabetes mellitus without complications: Secondary | ICD-10-CM

## 2012-11-19 LAB — CBC
HCT: 43.8 % (ref 39.0–52.0)
Hemoglobin: 14.8 g/dL (ref 13.0–17.0)
RDW: 14.3 % (ref 11.5–15.5)
WBC: 5.7 10*3/uL (ref 4.0–10.5)

## 2012-11-19 MED ORDER — LISINOPRIL 10 MG PO TABS: 10.0000 mg | ORAL_TABLET | Freq: Every day | ORAL | Status: DC

## 2012-11-19 MED ORDER — TAMSULOSIN HCL 0.4 MG PO CAPS: 0.4000 mg | ORAL_CAPSULE | Freq: Every day | ORAL | Status: DC

## 2012-11-19 MED ORDER — GLIPIZIDE 10 MG PO TABS: ORAL_TABLET | ORAL | Status: DC

## 2012-11-19 MED ORDER — METFORMIN HCL 1000 MG PO TABS: ORAL_TABLET | ORAL | Status: DC

## 2012-11-19 NOTE — Patient Instructions (Addendum)
Get the labs done fasting in the morning Glipizide increased to 10mg  twice a day Continue metformin Restart lisinopril for blood pressure and kidney protection Start baby aspirin Check your blood sugar fasting F/U 3 months

## 2012-11-20 LAB — COMPREHENSIVE METABOLIC PANEL
BUN: 10 mg/dL (ref 6–23)
CO2: 28 mEq/L (ref 19–32)
Calcium: 11 mg/dL — ABNORMAL HIGH (ref 8.4–10.5)
Chloride: 102 mEq/L (ref 96–112)
Creat: 1.13 mg/dL (ref 0.50–1.35)

## 2012-11-20 LAB — LIPID PANEL
Cholesterol: 192 mg/dL (ref 0–200)
Triglycerides: 151 mg/dL — ABNORMAL HIGH (ref <150)

## 2012-11-20 LAB — HEMOGLOBIN A1C: Hgb A1c MFr Bld: 9.4 % — ABNORMAL HIGH (ref <5.7)

## 2012-11-21 ENCOUNTER — Encounter: Payer: Self-pay | Admitting: Family Medicine

## 2012-11-21 MED ORDER — SIMVASTATIN 10 MG PO TABS: 10.0000 mg | ORAL_TABLET | Freq: Every evening | ORAL | Status: DC

## 2012-11-21 NOTE — Assessment & Plan Note (Signed)
Unchanged, reiterated importance of diet adherence with diabetes and cardiovascular risks of his obesity

## 2012-11-21 NOTE — Assessment & Plan Note (Signed)
Check fasting lipid panel and decide on statin therapy

## 2012-11-21 NOTE — Assessment & Plan Note (Signed)
Restart low dose ACEI, Urine Micro obtained, restart baby aspirin

## 2012-11-21 NOTE — Addendum Note (Signed)
Addended by: Milinda Antis F on: 11/21/2012 09:44 PM   Modules accepted: Orders

## 2012-11-21 NOTE — Progress Notes (Signed)
  Subjective:    [name], male    DOB: 10-23-1960, 52 y.o.   MRN: [id number]  HPI  Pt here to f/u chronic medical problems, he his overdue for follow-up. He has not been checking his CBG, last A1C 7.5%, he is not eating well, eats a lot of fast food and junk food. Ran out of lisinopril and did not restart. 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  Review of Systems  GEN- denies fatigue, fever, weight loss,weakness, recent illness HEENT- denies eye drainage, change in vision, nasal discharge, CVS- denies chest pain, palpitations RESP- denies SOB, cough, wheeze ABD- denies N/V, change in stools, abd pain GU- denies dysuria, hematuria, dribbling, incontinence MSK- denies joint pain, muscle aches, injury Neuro- denies headache, dizziness, syncope, seizure activity      Objective:   Physical Exam GEN- NAD, alert and oriented x3,obese HEENT- PERRL, EOMI, non injected sclera, pink conjunctiva, MMM, oropharynx clear Neck- Supple,  CVS- RRR, no murmur RESP-CTAB EXT- No edema Pulses- Radial, DP- 2+        Assessment & Plan:

## 2012-11-21 NOTE — Assessment & Plan Note (Signed)
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

## 2012-11-23 LAB — MICROALBUMIN / CREATININE URINE RATIO
Creatinine, Urine: 176 mg/dL
Microalb Creat Ratio: 13 mg/g (ref 0.0–30.0)

## 2012-12-17 ENCOUNTER — Telehealth: Payer: Self-pay | Admitting: Family Medicine

## 2012-12-17 MED ORDER — SIMVASTATIN 40 MG PO TABS: 40.0000 mg | ORAL_TABLET | Freq: Every day | ORAL | Status: DC

## 2012-12-17 NOTE — Telephone Encounter (Signed)
Please let pt know, his diabetes is worse A1C is now 9.4%, he needs to take the increased dose of glipizide twice a day with his metformin like we discussed His cholesterol is high, his LDL needs to be below 100 - restart cholesterol medication at bedtime- Script sent Kidney function okay Take the lisinopril 1 tablet daily for blood pressure and kidney protection, he had some mild protein in his urine this is due to the uncontrolled diabetes Make sure he checks his blood sugar twice a day  Let him know I will be going to [employer]

## 2012-12-17 NOTE — Telephone Encounter (Signed)
Wants results of labs. There is no result message.  Please advise

## 2012-12-22 NOTE — Telephone Encounter (Signed)
Both numbers disconnected. Mailed letter to contact the office

## 2013-03-18 ENCOUNTER — Encounter: Payer: Self-pay | Admitting: Family Medicine

## 2013-03-18 ENCOUNTER — Ambulatory Visit (INDEPENDENT_AMBULATORY_CARE_PROVIDER_SITE_OTHER): Payer: BC Managed Care – PPO | Admitting: Family Medicine

## 2013-03-18 VITALS — BP 116/74 | HR 68 | Resp 18 | Wt 238.0 lb

## 2013-03-18 DIAGNOSIS — N400 Enlarged prostate without lower urinary tract symptoms: Secondary | ICD-10-CM

## 2013-03-18 DIAGNOSIS — E669 Obesity, unspecified: Secondary | ICD-10-CM

## 2013-03-18 DIAGNOSIS — E119 Type 2 diabetes mellitus without complications: Secondary | ICD-10-CM

## 2013-03-18 DIAGNOSIS — I10 Essential (primary) hypertension: Secondary | ICD-10-CM

## 2013-03-18 DIAGNOSIS — E785 Hyperlipidemia, unspecified: Secondary | ICD-10-CM

## 2013-03-18 LAB — HEMOGLOBIN A1C, FINGERSTICK: Hgb A1C (fingerstick): 8 % — ABNORMAL HIGH (ref <5.7)

## 2013-03-18 LAB — BASIC METABOLIC PANEL
BUN: 14 mg/dL (ref 6–23)
Chloride: 106 mEq/L (ref 96–112)
Creat: 1.1 mg/dL (ref 0.50–1.35)

## 2013-03-18 MED ORDER — DOXAZOSIN MESYLATE ER 4 MG PO TB24: 4.0000 mg | ORAL_TABLET | Freq: Every day | ORAL | Status: DC

## 2013-03-18 NOTE — Patient Instructions (Addendum)
Stop flomax, start cardura once a day for the prostate Continue current diabetes metformin and glipizide  Continue lisinopril  We will call with lab results for cholesterol, prostate and kidney's Work on diet and exercise Goal 10lbs weight loss  A1C was 8%, goal is < 7% F/U 3 months

## 2013-03-19 ENCOUNTER — Encounter: Payer: Self-pay | Admitting: Family Medicine

## 2013-03-20 DIAGNOSIS — N138 Other obstructive and reflux uropathy: Secondary | ICD-10-CM | POA: Insufficient documentation

## 2013-03-20 DIAGNOSIS — N400 Enlarged prostate without lower urinary tract symptoms: Secondary | ICD-10-CM | POA: Insufficient documentation

## 2013-03-20 NOTE — Assessment & Plan Note (Signed)
LDL improved continue zocor, goal , 100

## 2013-03-20 NOTE — Assessment & Plan Note (Signed)
Repeat PSA, discussed the lab pros and cons Will change to cardura to see if this helps symptoms better than flomax

## 2013-03-20 NOTE — Assessment & Plan Note (Signed)
No change in weight, diet discussed

## 2013-03-20 NOTE — Progress Notes (Signed)
  Subjective:    [name], male    DOB: 16-Jun-1961, 52 y.o.   MRN: [id number]  HPI Pt here to f/u HTN and DM. Last visit restarted on medications. Denies any side effects DM- does not take CBG on regular basis, no hypoglycemia. Last A1C 9.4% HTN- does not take bP at home, has not changed his diet very much. No exercise.  BPH- has been taking flomax but this does not help very much, he continues to have hesitancy and dribbling at times. Denies dysuria.  Due for fasting labs   Review of Systems - per above   GEN- denies fatigue, fever, weight loss,weakness, recent illness HEENT- denies eye drainage, change in vision, nasal discharge, CVS- denies chest pain, palpitations RESP- denies SOB, cough, wheeze ABD- denies N/V, change in stools, abd pain GU- denies dysuria, hematuria, +dribbling, incontinence MSK- denies joint pain, muscle aches, injury ENDO- no polyuria, no polydipsia Neuro- denies headache, dizziness, syncope, seizure activity      Objective:   Physical Exam GEN- NAD, alert and oriented x3 HEENT- PERRL, EOMI, non injected sclera, pink conjunctiva, MMM, oropharynx clear CVS- RRR, no murmur RESP-CTAB EXT- No edema Pulses- Radial, DP- 2+        Assessment & Plan:

## 2013-03-20 NOTE — Assessment & Plan Note (Signed)
A1C improved to 8%, continue to work on diet, no change to medications. Discussed diet in detail with patient He is committed to change, with this improvement I will hold off on any additional meds

## 2013-03-20 NOTE — Assessment & Plan Note (Signed)
BP much improved, on ACEI, no change to meds

## 2013-05-06 ENCOUNTER — Telehealth: Payer: Self-pay | Admitting: Family Medicine

## 2013-05-06 MED ORDER — GLIPIZIDE 10 MG PO TABS: ORAL_TABLET | ORAL | Status: DC

## 2013-05-06 NOTE — Telephone Encounter (Signed)
Glipizide 10 1 BID #60

## 2013-05-06 NOTE — Telephone Encounter (Signed)
Meds refilled.

## 2013-08-02 ENCOUNTER — Other Ambulatory Visit: Payer: Self-pay | Admitting: Family Medicine

## 2013-08-02 ENCOUNTER — Encounter: Payer: Self-pay | Admitting: Family Medicine

## 2013-08-02 NOTE — Telephone Encounter (Signed)
Medication refill for one time only.  Patient needs to be seen.  Letter sent for patient to call and schedule 

## 2013-09-14 ENCOUNTER — Other Ambulatory Visit: Payer: Self-pay | Admitting: Family Medicine

## 2013-09-14 NOTE — Telephone Encounter (Signed)
tamsulosin 0.4mg , DENIED, pt needs office visit.

## 2013-09-15 ENCOUNTER — Telehealth: Payer: Self-pay | Admitting: *Deleted

## 2013-09-15 MED ORDER — TAMSULOSIN HCL 0.4 MG PO CAPS: 0.4000 mg | ORAL_CAPSULE | Freq: Every day | ORAL | Status: DC

## 2013-09-15 NOTE — Telephone Encounter (Signed)
REFILLED FOR 1 MONTH ONLY NO FURTHER REFILLS UNTIL PT IS SEEN

## 2013-11-02 ENCOUNTER — Encounter: Payer: Self-pay | Admitting: Family Medicine

## 2013-11-02 ENCOUNTER — Ambulatory Visit (INDEPENDENT_AMBULATORY_CARE_PROVIDER_SITE_OTHER): Payer: BC Managed Care – PPO | Admitting: Family Medicine

## 2013-11-02 VITALS — BP 138/72 | HR 76 | Temp 98.2°F | Resp 18 | Ht 67.5 in | Wt 238.0 lb

## 2013-11-02 DIAGNOSIS — E785 Hyperlipidemia, unspecified: Secondary | ICD-10-CM

## 2013-11-02 DIAGNOSIS — N400 Enlarged prostate without lower urinary tract symptoms: Secondary | ICD-10-CM

## 2013-11-02 DIAGNOSIS — E669 Obesity, unspecified: Secondary | ICD-10-CM

## 2013-11-02 DIAGNOSIS — Z23 Encounter for immunization: Secondary | ICD-10-CM

## 2013-11-02 DIAGNOSIS — I10 Essential (primary) hypertension: Secondary | ICD-10-CM

## 2013-11-02 DIAGNOSIS — E119 Type 2 diabetes mellitus without complications: Secondary | ICD-10-CM

## 2013-11-02 LAB — MICROALBUMIN / CREATININE URINE RATIO
Creatinine, Urine: 151.5 mg/dL
Microalb Creat Ratio: 16.4 mg/g (ref 0.0–30.0)
Microalb, Ur: 2.48 mg/dL — ABNORMAL HIGH (ref 0.00–1.89)

## 2013-11-02 LAB — LIPID PANEL
CHOL/HDL RATIO: 5.8 ratio
Cholesterol: 134 mg/dL (ref 0–200)
HDL: 23 mg/dL — ABNORMAL LOW (ref >39)
LDL Cholesterol: 76 mg/dL (ref 0–99)
TRIGLYCERIDES: 176 mg/dL — AB (ref <150)
VLDL: 35 mg/dL (ref 0–40)

## 2013-11-02 LAB — CBC WITH DIFFERENTIAL/PLATELET
Basophils Absolute: 0 10*3/uL (ref 0.0–0.1)
Basophils Relative: 0 % (ref 0–1)
Eosinophils Absolute: 0.2 10*3/uL (ref 0.0–0.7)
Eosinophils Relative: 4 % (ref 0–5)
HEMATOCRIT: 43.1 % (ref 39.0–52.0)
HEMOGLOBIN: 14.4 g/dL (ref 13.0–17.0)
LYMPHS ABS: 2.3 10*3/uL (ref 0.7–4.0)
Lymphocytes Relative: 39 % (ref 12–46)
MCH: 28.8 pg (ref 26.0–34.0)
MCHC: 33.4 g/dL (ref 30.0–36.0)
MCV: 86.2 fL (ref 78.0–100.0)
MONO ABS: 0.4 10*3/uL (ref 0.1–1.0)
MONOS PCT: 7 % (ref 3–12)
NEUTROS ABS: 2.9 10*3/uL (ref 1.7–7.7)
NEUTROS PCT: 50 % (ref 43–77)
Platelets: 196 10*3/uL (ref 150–400)
RBC: 5 MIL/uL (ref 4.22–5.81)
RDW: 13.7 % (ref 11.5–15.5)
WBC: 5.8 10*3/uL (ref 4.0–10.5)

## 2013-11-02 LAB — COMPREHENSIVE METABOLIC PANEL
ALBUMIN: 3.9 g/dL (ref 3.5–5.2)
ALT: 23 U/L (ref 0–53)
AST: 16 U/L (ref 0–37)
Alkaline Phosphatase: 65 U/L (ref 39–117)
BUN: 12 mg/dL (ref 6–23)
CALCIUM: 10.5 mg/dL (ref 8.4–10.5)
CO2: 26 meq/L (ref 19–32)
Chloride: 106 mEq/L (ref 96–112)
Creat: 1.05 mg/dL (ref 0.50–1.35)
GLUCOSE: 198 mg/dL — AB (ref 70–99)
POTASSIUM: 4.8 meq/L (ref 3.5–5.3)
SODIUM: 136 meq/L (ref 135–145)
TOTAL PROTEIN: 6.4 g/dL (ref 6.0–8.3)
Total Bilirubin: 0.5 mg/dL (ref 0.2–1.2)

## 2013-11-02 LAB — HEMOGLOBIN A1C, FINGERSTICK: HEMOGLOBIN A1C, FINGERSTICK: 11.2 % — AB (ref <5.7)

## 2013-11-02 MED ORDER — GLIPIZIDE 10 MG PO TABS: ORAL_TABLET | ORAL | Status: DC

## 2013-11-02 MED ORDER — METFORMIN HCL 1000 MG PO TABS: ORAL_TABLET | ORAL | Status: DC

## 2013-11-02 MED ORDER — LISINOPRIL 10 MG PO TABS: ORAL_TABLET | ORAL | Status: DC

## 2013-11-02 MED ORDER — TAMSULOSIN HCL 0.4 MG PO CAPS: 0.4000 mg | ORAL_CAPSULE | Freq: Every day | ORAL | Status: DC

## 2013-11-02 MED ORDER — SIMVASTATIN 40 MG PO TABS
40.0000 mg | ORAL_TABLET | Freq: Every day | ORAL | Status: DC
Start: 2013-11-02 — End: 2014-04-18

## 2013-11-02 MED ORDER — LIRAGLUTIDE 18 MG/3ML ~~LOC~~ SOPN: PEN_INJECTOR | SUBCUTANEOUS | Status: DC

## 2013-11-02 NOTE — Assessment & Plan Note (Addendum)
A1c checked in the office and is uncontrolled 11.2%. He is a very poor diet and is always on the road driving. I'm not sure what the new regulations if he is able to drive but insulin so we'll start him on Victoza and continue oral medications When he was watching his diet his A1c was the least below 8% Pneumonia vaccine given.

## 2013-11-02 NOTE — Assessment & Plan Note (Signed)
Continue Flomax he did better with this than the Cardura

## 2013-11-02 NOTE — Assessment & Plan Note (Signed)
On statin drug Check FLP

## 2013-11-02 NOTE — Patient Instructions (Signed)
Start victoza  Once a day at 0.6mg  injection We will call with other medications F/U 6 weeks

## 2013-11-02 NOTE — Progress Notes (Signed)
[name], male   DOB: 1961/07/23, 53 y.o.   MRN: [id number]   Subjective:    [name], male    DOB: [birth year], 53 y.o.   MRN: [id number]  Patient presents for F/U  Patient here to follow chronic medical problems. History of diabetes mellitus hyperlipidemia hypertension. 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 He's not been checking his blood sugar on a regular basis either but states he is taking his medications. He is out of the lisinopril. He does not have any symptoms of hypoglycemia. He is due for labs today  He does get cramping in his legs on and off when he is sitting in a truck for long periods of time.   Review Of Systems:  GEN- denies fatigue, fever, weight loss,weakness, recent illness HEENT- denies eye drainage, change in vision, nasal discharge, CVS- denies chest pain, palpitations RESP- denies SOB, cough, wheeze ABD- denies N/V, change in stools, abd pain GU- denies dysuria, hematuria, dribbling, incontinence MSK- denies joint pain, muscle aches, injury Neuro- denies headache, dizziness, syncope, seizure activity       Objective:    BP 138/72  Pulse 76  Temp(Src) 98.2 F (36.8 C)  Resp 18  Ht 5' 7.5" (1.715 m)  Wt 238 lb (107.956 kg)  BMI 36.70 kg/m2 GEN- NAD, alert and oriented x3,obese HEENT- PERRL, EOMI, non injected sclera, pink conjunctiva, MMM, oropharynx clear CVS- RRR, no murmur RESP-CTAB EXT- No edema Pulses- Radial, DP- 2+        Assessment & Plan:      Problem List Items Addressed This Visit   Type II or unspecified type diabetes mellitus without mention of complication, not stated as uncontrolled - Primary     A1c checked in the office and is uncontrolled 11.2%. He is a very poor diet and is always on the road driving. I'm not sure what the new regulations if he is able to drive but insulin so we'll start him on Victoza and continue oral medications When he was watching  his diet his A1c was the least below 8% Pneumonia vaccine given.    Relevant Medications      Liraglutide 18 MG/3ML SOPN      metFORMIN (GLUCOPHAGE) tablet      lisinopril (PRINIVIL,ZESTRIL) tablet      glipiZIDE (GLUCOTROL) tablet      simvastatin (ZOCOR) tablet   Other Relevant Orders      CBC with Differential      Comprehensive metabolic panel      Hemoglobin A1C, fingerstick (Completed)      Microalbumin / creatinine urine ratio   Obesity, unspecified     Discussed his diet and need for weight loss. His job makes this very difficult as he eats on the road all day    Hyperlipidemia     On statin drug Check FLP    Relevant Orders      Lipid panel   Essential hypertension, benign     Blood pressure is minimally elevated but he does not have the lisinopril we'll restart    BPH (benign prostatic hyperplasia)     Continue Flomax he did better with this than the Cardura    Relevant Medications      tamsulosin (FLOMAX) 0.4 MG CAPS capsule    Other Visit Diagnoses   Need for prophylactic vaccination against Streptococcus pneumoniae (pneumococcus)        Relevant Orders  Pneumococcal conjugate vaccine 13-valent (Completed)       Note: This dictation was prepared with Dragon dictation along with smaller phrase technology. Any transcriptional errors that result from this process are unintentional.

## 2013-11-02 NOTE — Assessment & Plan Note (Signed)
Discussed his diet and need for weight loss. His job makes this very difficult as he eats on the road all day

## 2013-11-02 NOTE — Assessment & Plan Note (Signed)
Blood pressure is minimally elevated but he does not have the lisinopril we'll restart

## 2013-11-03 ENCOUNTER — Encounter: Payer: Self-pay | Admitting: *Deleted

## 2013-11-07 ENCOUNTER — Other Ambulatory Visit: Payer: Self-pay | Admitting: *Deleted

## 2013-11-07 MED ORDER — TAMSULOSIN HCL 0.4 MG PO CAPS: 0.4000 mg | ORAL_CAPSULE | Freq: Every day | ORAL | Status: DC

## 2013-11-07 NOTE — Telephone Encounter (Signed)
Refill appropriate and filled per protocol. 

## 2013-12-16 ENCOUNTER — Ambulatory Visit (INDEPENDENT_AMBULATORY_CARE_PROVIDER_SITE_OTHER): Payer: BC Managed Care – PPO | Admitting: Family Medicine

## 2013-12-16 ENCOUNTER — Encounter: Payer: Self-pay | Admitting: Family Medicine

## 2013-12-16 VITALS — BP 130/68 | HR 84 | Temp 98.2°F | Resp 16 | Ht 67.0 in | Wt 240.0 lb

## 2013-12-16 DIAGNOSIS — I10 Essential (primary) hypertension: Secondary | ICD-10-CM

## 2013-12-16 DIAGNOSIS — E119 Type 2 diabetes mellitus without complications: Secondary | ICD-10-CM

## 2013-12-16 NOTE — Assessment & Plan Note (Signed)
Blood pressure looks good with the lisinopril this will also help with the urine micral albumin that he had

## 2013-12-16 NOTE — Progress Notes (Signed)
[name], male   DOB: 01-Nov-1960, 54 y.o.   MRN: [id number]   Subjective:    [name], male    DOB: [birth year], 53 y.o.   MRN: [id number]  Patient presents for Diabetes and Hypertension  Patient here to followup interim visit for his diabetes and hypertension. Her last visit 6 weeks ago his A1c was 11.2%. He was started on Victoza 0.6 mg and continued on metformin and glipizide. He states his fasting sugars have been around 150 he has had some hypoglycemia since starting the medication noted a few days but he had some bloating after giving the injection. He's not had any change in his bowel movements or any urinary symptoms. He's not had any abdominal pain nausea vomiting. 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   Review Of Systems:  GEN- denies fatigue, fever, weight loss,weakness, recent illness HEENT- denies eye drainage, change in vision, nasal discharge, CVS- denies chest pain, palpitations RESP- denies SOB, cough, wheeze ABD- denies N/V, change in stools, abd pain GU- denies dysuria, hematuria, dribbling, incontinence MSK- denies joint pain, muscle aches, injury Neuro- denies headache, dizziness, syncope, seizure activity       Objective:    BP 130/68  Pulse 84  Temp(Src) 98.2 F (36.8 C) (Oral)  Resp 16  Ht 5\' 7"  (1.702 m)  Wt 240 lb (108.863 kg)  BMI 37.58 kg/m2 GEN- NAD, alert and oriented x3 CVS- RRR, no murmur RESP-CTAB ABD-NABS,soft,NT,ND, mild erythema at previous injection site EXT- No edema Pulses- Radial 2+        Assessment & Plan:      Problem List Items Addressed This Visit   Type II or unspecified type diabetes mellitus without mention of complication, not stated as uncontrolled - Primary     Fasting blood sugars are improving. 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 She will continue metformin and glipizide. We have made  vast improvement in his fasting sugars. There is a small percentage of patients to complain of bloating with the Vicotoza however it is not consistent with him so we will monitor for now    Essential hypertension, benign     Blood pressure looks good with the lisinopril this will also help with the urine micral albumin that he had       Note: This dictation was prepared with Dragon dictation along with smaller phrase technology. Any transcriptional errors that result from this process are unintentional.

## 2013-12-16 NOTE — Patient Instructions (Signed)
Continue victoza at 0.6mg  and continue Glipizide and Metformin Blood sugars are much improved F/U 8 weeks for repeat labs

## 2013-12-16 NOTE — Assessment & Plan Note (Addendum)
Fasting blood sugars are improving. 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 She will continue metformin and glipizide. We have made vast improvement in his fasting sugars. There is a small percentage of patients to complain of bloating with the Vicotoza however it is not consistent with him so we will monitor for now

## 2014-02-06 ENCOUNTER — Ambulatory Visit (INDEPENDENT_AMBULATORY_CARE_PROVIDER_SITE_OTHER): Payer: BC Managed Care – PPO | Admitting: Family Medicine

## 2014-02-06 ENCOUNTER — Encounter: Payer: Self-pay | Admitting: Family Medicine

## 2014-02-06 ENCOUNTER — Other Ambulatory Visit: Payer: Self-pay | Admitting: Family Medicine

## 2014-02-06 VITALS — BP 132/76 | HR 72 | Temp 97.8°F | Resp 12 | Ht 67.5 in | Wt 238.0 lb

## 2014-02-06 DIAGNOSIS — E785 Hyperlipidemia, unspecified: Secondary | ICD-10-CM

## 2014-02-06 DIAGNOSIS — I10 Essential (primary) hypertension: Secondary | ICD-10-CM

## 2014-02-06 DIAGNOSIS — E119 Type 2 diabetes mellitus without complications: Secondary | ICD-10-CM

## 2014-02-06 LAB — COMPREHENSIVE METABOLIC PANEL
ALBUMIN: 4.3 g/dL (ref 3.5–5.2)
ALT: 15 U/L (ref 0–53)
AST: 17 U/L (ref 0–37)
Alkaline Phosphatase: 76 U/L (ref 39–117)
BUN: 16 mg/dL (ref 6–23)
CALCIUM: 11.8 mg/dL — AB (ref 8.4–10.5)
CHLORIDE: 106 meq/L (ref 96–112)
CO2: 26 mEq/L (ref 19–32)
Creat: 1.09 mg/dL (ref 0.50–1.35)
GLUCOSE: 48 mg/dL — AB (ref 70–99)
POTASSIUM: 4.5 meq/L (ref 3.5–5.3)
Sodium: 137 mEq/L (ref 135–145)
TOTAL PROTEIN: 7.3 g/dL (ref 6.0–8.3)
Total Bilirubin: 0.5 mg/dL (ref 0.2–1.2)

## 2014-02-06 LAB — CBC WITH DIFFERENTIAL/PLATELET
Basophils Absolute: 0 10*3/uL (ref 0.0–0.1)
Basophils Relative: 0 % (ref 0–1)
Eosinophils Absolute: 0.3 10*3/uL (ref 0.0–0.7)
Eosinophils Relative: 4 % (ref 0–5)
HCT: 42.3 % (ref 39.0–52.0)
HEMOGLOBIN: 14.3 g/dL (ref 13.0–17.0)
LYMPHS PCT: 38 % (ref 12–46)
Lymphs Abs: 2.5 10*3/uL (ref 0.7–4.0)
MCH: 28.7 pg (ref 26.0–34.0)
MCHC: 33.8 g/dL (ref 30.0–36.0)
MCV: 84.9 fL (ref 78.0–100.0)
MONO ABS: 0.6 10*3/uL (ref 0.1–1.0)
MONOS PCT: 9 % (ref 3–12)
NEUTROS ABS: 3.2 10*3/uL (ref 1.7–7.7)
Neutrophils Relative %: 49 % (ref 43–77)
Platelets: 247 10*3/uL (ref 150–400)
RBC: 4.98 MIL/uL (ref 4.22–5.81)
RDW: 14.2 % (ref 11.5–15.5)
WBC: 6.6 10*3/uL (ref 4.0–10.5)

## 2014-02-06 LAB — HEMOGLOBIN A1C, FINGERSTICK: Hgb A1C (fingerstick): 6.9 % — ABNORMAL HIGH (ref <5.7)

## 2014-02-06 NOTE — Assessment & Plan Note (Signed)
Goal LDL < 100, discussed dietary changes

## 2014-02-06 NOTE — Assessment & Plan Note (Signed)
A1c is at goal today this is a significant improvement from his last set of labs. He will continue the metformin glipizide and the fifth toes that she will continue to work on diet and weight loss On ACE inhibitor and statin drug

## 2014-02-06 NOTE — Progress Notes (Signed)
[name], male   DOB: 20-Jan-1961, 53 y.o.   MRN: [id number]   Subjective:    [name], male    DOB: 12-May-1961, 53 y.o.   MRN: [id number]  Patient presents for 2 month F/U  patient here to followup diabetes mellitus. CBG fasting 70-160, few elevated at 260 when he missed his victoza shot, evening CBG <200, no hypoglycemia, no polyuria Taking medications as prescribed Due for repeat FLP    Review Of Systems:  GEN- denies fatigue, fever, weight loss,weakness, recent illness HEENT- denies eye drainage, change in vision, nasal discharge, CVS- denies chest pain, palpitations RESP- denies SOB, cough, wheeze ABD- denies N/V, change in stools, abd pain GU- denies dysuria, hematuria, dribbling, incontinence MSK- denies joint pain, muscle aches, injury Neuro- denies headache, dizziness, syncope, seizure activity       Objective:    BP 132/76  Pulse 72  Temp(Src) 97.8 F (36.6 C) (Oral)  Resp 12  Ht 5' 7.5" (1.715 m)  Wt 238 lb (107.956 kg)  BMI 36.70 kg/m2 GEN- NAD, alert and oriented x3 HEENT- PERRL, EOMI, non injected sclera, pink conjunctiva, MMM, oropharynx clear CVS- RRR, no murmur RESP-CTAB EXT- No edema Pulses- Radial 2+        Assessment & Plan:      Problem List Items Addressed This Visit   Type II or unspecified type diabetes mellitus without mention of complication, not stated as uncontrolled - Primary   Relevant Medications      aspirin 81 MG tablet   Other Relevant Orders      CBC with Differential (Completed)      Comprehensive metabolic panel (Completed)      Hemoglobin A1C, fingerstick (Completed)   Hyperlipidemia   Relevant Medications      aspirin 81 MG tablet   Essential hypertension, benign   Relevant Medications      aspirin 81 MG tablet      Note: This dictation was prepared with Dragon dictation along with smaller phrase technology. Any transcriptional errors that result from this process are  unintentional.

## 2014-02-06 NOTE — Patient Instructions (Signed)
Continue current medications F/U 3 months A1C is 6.9%

## 2014-02-06 NOTE — Assessment & Plan Note (Signed)
BP  Looks good, no change

## 2014-02-07 ENCOUNTER — Other Ambulatory Visit: Payer: Self-pay | Admitting: *Deleted

## 2014-02-07 MED ORDER — BLOOD GLUCOSE MONITOR KIT: PACK | Status: DC

## 2014-02-07 NOTE — Telephone Encounter (Signed)
Patient in office on 02/06/2014 and reported that test strips for glucometer too expensive ($75.00).   Call placed to patient pharmacy. Was advised that there is no record of prescription for patient for testing supplies.   Prescription for kit e-scribed to pharmacy.

## 2014-02-09 LAB — VITAMIN D 25 HYDROXY (VIT D DEFICIENCY, FRACTURES): VIT D 25 HYDROXY: 23 ng/mL — AB (ref 30–89)

## 2014-02-10 ENCOUNTER — Ambulatory Visit: Payer: BC Managed Care – PPO | Admitting: Family Medicine

## 2014-02-13 ENCOUNTER — Ambulatory Visit: Payer: Self-pay | Admitting: Family Medicine

## 2014-04-18 ENCOUNTER — Other Ambulatory Visit: Payer: Self-pay | Admitting: Family Medicine

## 2014-04-18 NOTE — Telephone Encounter (Signed)
Refill appropriate and filled per protocol. 

## 2014-05-09 ENCOUNTER — Encounter: Payer: Self-pay | Admitting: Family Medicine

## 2014-05-09 ENCOUNTER — Ambulatory Visit (INDEPENDENT_AMBULATORY_CARE_PROVIDER_SITE_OTHER): Payer: BC Managed Care – PPO | Admitting: Family Medicine

## 2014-05-09 VITALS — BP 128/70 | HR 78 | Temp 98.2°F | Resp 14 | Ht 67.0 in | Wt 239.0 lb

## 2014-05-09 DIAGNOSIS — E119 Type 2 diabetes mellitus without complications: Secondary | ICD-10-CM

## 2014-05-09 DIAGNOSIS — I10 Essential (primary) hypertension: Secondary | ICD-10-CM

## 2014-05-09 DIAGNOSIS — K5900 Constipation, unspecified: Secondary | ICD-10-CM

## 2014-05-09 DIAGNOSIS — E785 Hyperlipidemia, unspecified: Secondary | ICD-10-CM

## 2014-05-09 DIAGNOSIS — Z23 Encounter for immunization: Secondary | ICD-10-CM

## 2014-05-09 MED ORDER — GLUCOSE BLOOD VI STRP: ORAL_STRIP | Status: DC

## 2014-05-09 MED ORDER — LISINOPRIL 10 MG PO TABS: ORAL_TABLET | ORAL | Status: DC

## 2014-05-09 MED ORDER — POLYETHYLENE GLYCOL 3350 17 GM/SCOOP PO POWD: ORAL | Status: DC

## 2014-05-09 MED ORDER — TAMSULOSIN HCL 0.4 MG PO CAPS: 0.4000 mg | ORAL_CAPSULE | Freq: Every day | ORAL | Status: DC

## 2014-05-09 NOTE — Assessment & Plan Note (Signed)
Well controlled 

## 2014-05-09 NOTE — Patient Instructions (Signed)
Continue current meds Trial of Miralax for constipation Flu shot given F/U 3 Months for PHYSICAL

## 2014-05-09 NOTE — Progress Notes (Signed)
[name], male   DOB: Aug 25, 1961, 54 y.o.   MRN: [id number]   Subjective:    [name], male    DOB: [birth year], 53 y.o.   MRN: [id number]  Patient presents for 3 month F/U  Pt here to f/u DM- last A1C 6.9%. His fasting blood sugars average 160-180. He is taking metformin and glipizide and fifth toes as prescribed. He's not had any hypoglycemia symptoms. Denies polyuria polydipsia He is due for flu shot  He continues to have bouts of constipation he has not tried any medication recently. Of note when he was seen by gastroenterology they advised MiraLAX but he has not tried this yet. He denies any blood in the stool   Review Of Systems: Per Above  GEN- denies fatigue, fever, weight loss,weakness, recent illness HEENT- denies eye drainage, change in vision, nasal discharge, CVS- denies chest pain, palpitations RESP- denies SOB, cough, wheeze ABD- denies N/V, change in stools, abd pain GU- denies dysuria, hematuria, dribbling, incontinence MSK- denies joint pain, muscle aches, injury Neuro- denies headache, dizziness, syncope, seizure activity       Objective:    BP 128/70  Pulse 78  Temp(Src) 98.2 F (36.8 C) (Oral)  Resp 14  Ht 5\' 7"  (1.702 m)  Wt 239 lb (108.41 kg)  BMI 37.42 kg/m2 GEN- NAD, alert and oriented x3 HEENT- PERRL, EOMI, non injected sclera, pink conjunctiva, MMM, oropharynx clear CVS- RRR, no murmur RESP-CTAB EXT- No edema Pulses- Radial, DP- 2+        Assessment & Plan:      Problem List Items Addressed This Visit   Type II or unspecified type diabetes mellitus without mention of complication, not stated as uncontrolled - Primary   Relevant Medications      lisinopril (PRINIVIL,ZESTRIL) tablet   Hyperlipidemia   Relevant Medications      lisinopril (PRINIVIL,ZESTRIL) tablet   Constipation   Relevant Medications      polyethylene glycol powder (MIRALAX) powder      Note: This dictation was prepared with  Dragon dictation along with smaller phrase technology. Any transcriptional errors that result from this process are unintentional.

## 2014-05-09 NOTE — Assessment & Plan Note (Signed)
Goals A1c less than 7%. Which he was at goal and her last visit. He's continued to work on dietary changes as well as weight loss. His goal to come off medications completely

## 2014-05-09 NOTE — Assessment & Plan Note (Signed)
Continue simvastatin. 

## 2014-05-09 NOTE — Assessment & Plan Note (Signed)
Trial of MiraLAX he was given samples from the office one scoopful daily

## 2014-05-11 ENCOUNTER — Other Ambulatory Visit: Payer: Self-pay | Admitting: Family Medicine

## 2014-05-13 NOTE — Telephone Encounter (Signed)
Refill appropriate and filled per protocol. 

## 2014-06-16 ENCOUNTER — Other Ambulatory Visit: Payer: Self-pay | Admitting: Family Medicine

## 2014-06-16 NOTE — Telephone Encounter (Signed)
Refill appropriate and filled per protocol. 

## 2014-08-11 ENCOUNTER — Encounter: Payer: Self-pay | Admitting: Family Medicine

## 2014-08-11 ENCOUNTER — Ambulatory Visit (INDEPENDENT_AMBULATORY_CARE_PROVIDER_SITE_OTHER): Payer: BC Managed Care – PPO | Admitting: Family Medicine

## 2014-08-11 VITALS — BP 132/80 | HR 68 | Temp 97.6°F | Resp 14 | Ht 67.0 in | Wt 244.0 lb

## 2014-08-11 DIAGNOSIS — E119 Type 2 diabetes mellitus without complications: Secondary | ICD-10-CM

## 2014-08-11 DIAGNOSIS — Z0000 Encounter for general adult medical examination without abnormal findings: Secondary | ICD-10-CM

## 2014-08-11 DIAGNOSIS — N400 Enlarged prostate without lower urinary tract symptoms: Secondary | ICD-10-CM

## 2014-08-11 DIAGNOSIS — Z125 Encounter for screening for malignant neoplasm of prostate: Secondary | ICD-10-CM

## 2014-08-11 DIAGNOSIS — E785 Hyperlipidemia, unspecified: Secondary | ICD-10-CM

## 2014-08-11 DIAGNOSIS — I10 Essential (primary) hypertension: Secondary | ICD-10-CM

## 2014-08-11 DIAGNOSIS — E669 Obesity, unspecified: Secondary | ICD-10-CM

## 2014-08-11 LAB — LIPID PANEL
Cholesterol: 146 mg/dL (ref 0–200)
HDL: 35 mg/dL — ABNORMAL LOW (ref >39)
LDL CALC: 90 mg/dL (ref 0–99)
TRIGLYCERIDES: 103 mg/dL (ref <150)
Total CHOL/HDL Ratio: 4.2 Ratio
VLDL: 21 mg/dL (ref 0–40)

## 2014-08-11 LAB — COMPREHENSIVE METABOLIC PANEL
ALBUMIN: 4 g/dL (ref 3.5–5.2)
ALK PHOS: 73 U/L (ref 39–117)
ALT: 24 U/L (ref 0–53)
AST: 19 U/L (ref 0–37)
BUN: 9 mg/dL (ref 6–23)
CALCIUM: 10.7 mg/dL — AB (ref 8.4–10.5)
CO2: 26 mEq/L (ref 19–32)
Chloride: 103 mEq/L (ref 96–112)
Creat: 1.11 mg/dL (ref 0.50–1.35)
Glucose, Bld: 89 mg/dL (ref 70–99)
POTASSIUM: 4.3 meq/L (ref 3.5–5.3)
SODIUM: 140 meq/L (ref 135–145)
TOTAL PROTEIN: 6.7 g/dL (ref 6.0–8.3)
Total Bilirubin: 0.7 mg/dL (ref 0.2–1.2)

## 2014-08-11 LAB — CBC WITH DIFFERENTIAL/PLATELET
BASOS PCT: 1 % (ref 0–1)
Basophils Absolute: 0 10*3/uL (ref 0.0–0.1)
Eosinophils Absolute: 0.2 10*3/uL (ref 0.0–0.7)
Eosinophils Relative: 5 % (ref 0–5)
HCT: 45.8 % (ref 39.0–52.0)
Hemoglobin: 14.9 g/dL (ref 13.0–17.0)
LYMPHS PCT: 45 % (ref 12–46)
Lymphs Abs: 1.8 10*3/uL (ref 0.7–4.0)
MCH: 28.6 pg (ref 26.0–34.0)
MCHC: 32.5 g/dL (ref 30.0–36.0)
MCV: 87.9 fL (ref 78.0–100.0)
MONO ABS: 0.4 10*3/uL (ref 0.1–1.0)
MPV: 9.8 fL (ref 9.4–12.4)
Monocytes Relative: 10 % (ref 3–12)
NEUTROS PCT: 39 % — AB (ref 43–77)
Neutro Abs: 1.6 10*3/uL — ABNORMAL LOW (ref 1.7–7.7)
PLATELETS: 209 10*3/uL (ref 150–400)
RBC: 5.21 MIL/uL (ref 4.22–5.81)
RDW: 13.6 % (ref 11.5–15.5)
WBC: 4 10*3/uL (ref 4.0–10.5)

## 2014-08-11 NOTE — Assessment & Plan Note (Signed)
Well controlled 

## 2014-08-11 NOTE — Assessment & Plan Note (Signed)
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

## 2014-08-11 NOTE — Assessment & Plan Note (Signed)
Continue flomax, PSA to be checked

## 2014-08-11 NOTE — Patient Instructions (Addendum)
Schedule eye exam  Continue current medications Low fat/ low carb diet, for weight loss We will lab results  F/u 3 MONTHS

## 2014-08-11 NOTE — Progress Notes (Signed)
[name], male   DOB: 1960/12/23, 53 y.o.   MRN: [id number]   Subjective:    [name], male    DOB: [birth year], 53 y.o.   MRN: [id number]  Patient presents for CPE No particular concerns Due for fasting labs and PSA COlonoscopy UTD Has had tetanus, was given Pneumovaccine 23 at visit in March, not Prevnar 13 DM- Last A1C 6.9%, BS rescently 200-220 fasting, not watching diet as much, no hypogelycemia symptoms, no polyuria, or polydipsia    Review Of Systems:  GEN- denies fatigue, fever, weight loss,weakness, recent illness HEENT- denies eye drainage, change in vision, nasal discharge, CVS- denies chest pain, palpitations RESP- denies SOB, cough, wheeze ABD- denies N/V, change in stools, abd pain GU- denies dysuria, hematuria, dribbling, incontinence MSK- denies joint pain, muscle aches, injury Neuro- denies headache, dizziness, syncope, seizure activity       Objective:    BP 132/80 mmHg  Pulse 68  Temp(Src) 97.6 F (36.4 C) (Oral)  Resp 14  Ht 5\' 7"  (1.702 m)  Wt 244 lb (110.678 kg)  BMI 38.21 kg/m2 GEN- NAD, alert and oriented x3 HEENT- PERRL, EOMI, non injected sclera, pink conjunctiva, MMM, oropharynx clear, TM clear bilat  Neck- Supple,  CVS- RRR, no murmur RESP-CTAB ABD-NABS,soft,NT,ND GU- normal external apperance, normal tone rectum, FOBT neg, prostate smooth, enlarged, no nodules EXT- No edema, thick nails Pulses- Radial, DP- 2+        Assessment & Plan:      Problem List Items Addressed This Visit      Unprioritized   Obesity   Hyperlipidemia   Relevant Orders      Lipid panel   Essential hypertension, benign   Diabetes mellitus type II, controlled - Primary   Relevant Orders      CBC with Differential      Comprehensive metabolic panel      Hemoglobin A1c   BPH (benign prostatic hyperplasia)    Other Visit Diagnoses    Routine general medical examination at a health care facility        CPE done,  prevenatitive care UTD with exception of Eye exam, he is to schedule in Jan. Fasting labs, will try to correct error of Prevnar 13 in chart,     Prostate cancer screening        Relevant Orders       PSA       Note: This dictation was prepared with Dragon dictation along with smaller phrase technology. Any transcriptional errors that result from this process are unintentional.

## 2014-08-12 LAB — PSA: PSA: 2.34 ng/mL (ref <=4.00)

## 2014-08-12 LAB — HEMOGLOBIN A1C
Hgb A1c MFr Bld: 7.9 % — ABNORMAL HIGH (ref <5.7)
Mean Plasma Glucose: 180 mg/dL — ABNORMAL HIGH (ref <117)

## 2014-09-02 ENCOUNTER — Other Ambulatory Visit: Payer: Self-pay | Admitting: Family Medicine

## 2014-09-04 NOTE — Telephone Encounter (Signed)
Medication called to pharmacy. 

## 2014-09-06 ENCOUNTER — Ambulatory Visit: Payer: Self-pay | Admitting: Family Medicine

## 2014-09-06 ENCOUNTER — Encounter: Payer: Self-pay | Admitting: Family Medicine

## 2014-09-06 ENCOUNTER — Ambulatory Visit (INDEPENDENT_AMBULATORY_CARE_PROVIDER_SITE_OTHER): Payer: BLUE CROSS/BLUE SHIELD | Admitting: Family Medicine

## 2014-09-06 VITALS — BP 136/74 | HR 78 | Temp 98.5°F | Resp 14 | Ht 67.0 in | Wt 240.0 lb

## 2014-09-06 DIAGNOSIS — M545 Low back pain, unspecified: Secondary | ICD-10-CM

## 2014-09-06 DIAGNOSIS — G5602 Carpal tunnel syndrome, left upper limb: Secondary | ICD-10-CM

## 2014-09-06 MED ORDER — KETOROLAC TROMETHAMINE 60 MG/2ML IM SOLN
60.0000 mg | Freq: Once | INTRAMUSCULAR | Status: AC
  Administered 2014-09-06: 60 mg via INTRAMUSCULAR

## 2014-09-06 MED ORDER — CYCLOBENZAPRINE HCL 5 MG PO TABS: 5.0000 mg | ORAL_TABLET | Freq: Three times a day (TID) | ORAL | Status: DC | PRN

## 2014-09-06 MED ORDER — HYDROCODONE-ACETAMINOPHEN 5-325 MG PO TABS: 1.0000 | ORAL_TABLET | Freq: Four times a day (QID) | ORAL | Status: DC | PRN

## 2014-09-06 MED ORDER — NAPROXEN 500 MG PO TABS: 500.0000 mg | ORAL_TABLET | Freq: Two times a day (BID) | ORAL | Status: DC

## 2014-09-06 NOTE — Patient Instructions (Signed)
Toradal shot given Take naprosyn twice a day with food Pain medication, muscle realaxer Carpal tunnel brace Call me about the nerve conduction F/U as previous

## 2014-09-06 NOTE — Progress Notes (Signed)
[name], male   DOB: [birth year], 54 y.o.   MRN: [id number]   Subjective:    [name], male    DOB: July 02, 1961, 54 y.o.   MRN: [id number]  Patient presents for Low Back Pain and Tingling  patient here with acute back pain. About a week ago he began having left-sided back pain no radiation. No change in bowel or bladder. He felt very stiff after he woke up. 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 He also complains some tingling in his left hand that has worsened in the past few months the tingling will come mostly in the thumb through the fourth digit if he bends his hand in any particular way or when he's driving he feels like he has to shake it out. He did take the Tylenol No. 3 which she was given from the dentist that help ease his back pain some he is not as stiff as he was before but still has some pain when he tries to bend over   Review Of Systems:  GEN- denies fatigue, fever, weight loss,weakness, recent illness HEENT- denies eye drainage, change in vision, nasal discharge, CVS- denies chest pain, palpitations RESP- denies SOB, cough, wheeze ABD- denies N/V, change in stools, abd pain GU- denies dysuria, hematuria, dribbling, incontinence MSK-+ joint pain, muscle aches, injury Neuro- denies headache, dizziness, syncope, seizure activity       Objective:    BP 136/74 mmHg  Pulse 78  Temp(Src) 98.5 F (36.9 C) (Oral)  Resp 14  Ht 5\' 7"  (1.702 m)  Wt 240 lb (108.863 kg)  BMI 37.58 kg/m2 GEN- NAD, alert and oriented x3 HEENT- PERRL, EOMI, non injected sclera, pink conjunctiva, MMM, oropharynx clear Neck- Supple, FROM MSK- mild TTP lumbar spine and left paraspinals, +spasm, fair ROM pain with flexion, neg SLR, Good ROM hips, knees Upper ext FROM, neg tinels, ?phalens left side EXT- No edema Pulses- Radial 2+        Assessment & Plan:      Problem List Items Addressed This Visit    None     Visit Diagnoses    Left-sided low back pain without sciatica    -  Primary    MSK pain, from sitting with spasm, hold on imaging no red flags, Toradol injection, naprosyn while driving, norco when off duty and flexeril    Relevant Medications    naproxen (NAPROSYN) tablet    HYDROcodone-acetaminophen (NORCO) 5-325 MG per tablet    cyclobenzaprine (FLEXERIL) tablet    ketorolac (TORADOL) injection 60 mg (Completed)    Carpal tunnel syndrome of left wrist        Given wrist brace to try first, nerve conduction test, 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 Any transcriptional errors that result from this process are unintentional.

## 2014-11-13 ENCOUNTER — Encounter: Payer: Self-pay | Admitting: Family Medicine

## 2014-11-13 ENCOUNTER — Ambulatory Visit (INDEPENDENT_AMBULATORY_CARE_PROVIDER_SITE_OTHER): Payer: Self-pay | Admitting: Family Medicine

## 2014-11-13 ENCOUNTER — Other Ambulatory Visit: Payer: Self-pay | Admitting: Family Medicine

## 2014-11-13 VITALS — BP 118/80 | HR 80 | Temp 97.9°F | Resp 18 | Ht 67.0 in | Wt 237.0 lb

## 2014-11-13 DIAGNOSIS — I10 Essential (primary) hypertension: Secondary | ICD-10-CM

## 2014-11-13 DIAGNOSIS — E785 Hyperlipidemia, unspecified: Secondary | ICD-10-CM

## 2014-11-13 DIAGNOSIS — E669 Obesity, unspecified: Secondary | ICD-10-CM

## 2014-11-13 DIAGNOSIS — E119 Type 2 diabetes mellitus without complications: Secondary | ICD-10-CM

## 2014-11-13 LAB — COMPREHENSIVE METABOLIC PANEL
ALBUMIN: 4.2 g/dL (ref 3.5–5.2)
ALT: 27 U/L (ref 0–53)
AST: 17 U/L (ref 0–37)
Alkaline Phosphatase: 73 U/L (ref 39–117)
BUN: 13 mg/dL (ref 6–23)
CALCIUM: 11.2 mg/dL — AB (ref 8.4–10.5)
CO2: 27 meq/L (ref 19–32)
Chloride: 98 mEq/L (ref 96–112)
Creat: 1.29 mg/dL (ref 0.50–1.35)
Glucose, Bld: 332 mg/dL — ABNORMAL HIGH (ref 70–99)
Potassium: 4.4 mEq/L (ref 3.5–5.3)
SODIUM: 135 meq/L (ref 135–145)
Total Bilirubin: 0.6 mg/dL (ref 0.2–1.2)
Total Protein: 7 g/dL (ref 6.0–8.3)

## 2014-11-13 LAB — MICROALBUMIN / CREATININE URINE RATIO
Creatinine, Urine: 75.7 mg/dL
Microalb Creat Ratio: 9.2 mg/g (ref 0.0–30.0)
Microalb, Ur: 0.7 mg/dL (ref <2.0)

## 2014-11-13 LAB — CBC WITH DIFFERENTIAL/PLATELET
BASOS PCT: 1 % (ref 0–1)
Basophils Absolute: 0 10*3/uL (ref 0.0–0.1)
Eosinophils Absolute: 0.2 10*3/uL (ref 0.0–0.7)
Eosinophils Relative: 4 % (ref 0–5)
HEMATOCRIT: 47 % (ref 39.0–52.0)
HEMOGLOBIN: 15.1 g/dL (ref 13.0–17.0)
LYMPHS ABS: 2.2 10*3/uL (ref 0.7–4.0)
LYMPHS PCT: 46 % (ref 12–46)
MCH: 28.3 pg (ref 26.0–34.0)
MCHC: 32.1 g/dL (ref 30.0–36.0)
MCV: 88.2 fL (ref 78.0–100.0)
MONOS PCT: 10 % (ref 3–12)
MPV: 10.2 fL (ref 8.6–12.4)
Monocytes Absolute: 0.5 10*3/uL (ref 0.1–1.0)
NEUTROS ABS: 1.8 10*3/uL (ref 1.7–7.7)
NEUTROS PCT: 39 % — AB (ref 43–77)
Platelets: 190 10*3/uL (ref 150–400)
RBC: 5.33 MIL/uL (ref 4.22–5.81)
RDW: 14.3 % (ref 11.5–15.5)
WBC: 4.7 10*3/uL (ref 4.0–10.5)

## 2014-11-13 LAB — LIPID PANEL
CHOLESTEROL: 218 mg/dL — AB (ref 0–200)
HDL: 36 mg/dL — ABNORMAL LOW (ref >=40)
LDL Cholesterol: 145 mg/dL — ABNORMAL HIGH (ref 0–99)
Total CHOL/HDL Ratio: 6.1 Ratio
Triglycerides: 187 mg/dL — ABNORMAL HIGH (ref <150)
VLDL: 37 mg/dL (ref 0–40)

## 2014-11-13 LAB — HEMOGLOBIN A1C, FINGERSTICK: Hgb A1C (fingerstick): 8.2 % — ABNORMAL HIGH (ref <5.7)

## 2014-11-13 MED ORDER — LIRAGLUTIDE 18 MG/3ML ~~LOC~~ SOPN: PEN_INJECTOR | SUBCUTANEOUS | Status: DC

## 2014-11-13 NOTE — Assessment & Plan Note (Signed)
Continue statin therapy goals LDL less than 100

## 2014-11-13 NOTE — Assessment & Plan Note (Signed)
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  Will restart MTF and glipizide, continue Victoza Goal A1C < 7% Continue to work [employer] loss Continue ASA, ACEI, STATIN

## 2014-11-13 NOTE — Progress Notes (Signed)
[name], male   DOB: Aug 03, 1961, 54 y.o.   MRN: [id number]   Subjective:    [name], male    DOB: [birth year], 54 y.o.   MRN: [id number]  Patient presents for 3 mos f/u  Pt here to f/u DM, no concerns today, last A1C 7.9%. He stopped taking oral medications a few weeks ago, states victoza at 1.6mg  daily was keeping his sugars down. Did not have meter to verify but states on average fasting sugars are 150. He has lost a few pounds since last visit as well. Denies polyuria, polydipsia, hypoglycemia. Taking all other meds as needed, does not requires pain meds for his back pain right now.     Review Of Systems:  GEN- denies fatigue, fever, weight loss,weakness, recent illness HEENT- denies eye drainage, change in vision, nasal discharge, CVS- denies chest pain, palpitations RESP- denies SOB, cough, wheeze ABD- denies N/V, change in stools, abd pain GU- denies dysuria, hematuria, dribbling, incontinence MSK- denies joint pain, muscle aches, injury Neuro- denies headache, dizziness, syncope, seizure activity       Objective:    BP 118/80 mmHg  Pulse 80  Temp(Src) 97.9 F (36.6 C) (Oral)  Resp 18  Ht 5\' 7"  (1.702 m)  Wt 237 lb (107.502 kg)  BMI 37.11 kg/m2 GEN- NAD, alert and oriented x3 HEENT- PERRL, EOMI, non injected sclera, pink conjunctiva, MMM, oropharynx clear CVS- RRR, no murmur RESP-CTAB EXT- No edema Pulses- Radial, DP- 2+        Assessment & Plan:      Problem List Items Addressed This Visit      Unprioritized   Hyperlipidemia   Relevant Orders   Lipid panel   Essential hypertension, benign   Diabetes mellitus type II, controlled - Primary   Relevant Medications   Liraglutide 18 MG/3ML SOPN   Other Relevant Orders   CBC with Differential/Platelet   Comprehensive metabolic panel   Microalbumin / creatinine urine ratio   Hemoglobin A1C, fingerstick (Completed)      Note: This dictation was prepared with Dragon  dictation along with smaller phrase technology. Any transcriptional errors that result from this process are unintentional.

## 2014-11-13 NOTE — Assessment & Plan Note (Signed)
Blood pressure is well controlled medication medication 

## 2014-11-13 NOTE — Patient Instructions (Signed)
Continue current medications I will call about which diabetes medications to continue We will call with lab results Good job on weight loss F/U 3 months

## 2014-11-13 NOTE — Addendum Note (Signed)
Addended by: Vic Blackbird F on: 11/13/2014 09:14 AM   Modules accepted: Orders

## 2014-11-13 NOTE — Assessment & Plan Note (Signed)
Continue to work on weight loss 

## 2014-11-15 LAB — VITAMIN D 25 HYDROXY (VIT D DEFICIENCY, FRACTURES): Vit D, 25-Hydroxy: 11 ng/mL — ABNORMAL LOW (ref 30–100)

## 2014-11-16 ENCOUNTER — Other Ambulatory Visit: Payer: Self-pay | Admitting: *Deleted

## 2014-11-16 MED ORDER — ATORVASTATIN CALCIUM 40 MG PO TABS: 40.0000 mg | ORAL_TABLET | Freq: Every day | ORAL | Status: DC

## 2014-11-16 MED ORDER — VITAMIN D (ERGOCALCIFEROL) 1.25 MG (50000 UNIT) PO CAPS: 50000.0000 [IU] | ORAL_CAPSULE | ORAL | Status: DC

## 2014-11-29 ENCOUNTER — Other Ambulatory Visit: Payer: Self-pay | Admitting: Family Medicine

## 2014-11-29 NOTE — Telephone Encounter (Signed)
Refill appropriate and filled per protocol. 

## 2014-12-25 ENCOUNTER — Encounter: Payer: Self-pay | Admitting: Internal Medicine

## 2015-02-12 ENCOUNTER — Other Ambulatory Visit: Payer: Self-pay | Admitting: Family Medicine

## 2015-02-12 NOTE — Telephone Encounter (Signed)
Refill appropriate and filled per protocol. 

## 2015-02-22 ENCOUNTER — Telehealth: Payer: Self-pay | Admitting: Family Medicine

## 2015-02-22 NOTE — Telephone Encounter (Signed)
PA requested from pharmacy for [place].  Submitted through Cover My Meds  Case # [id number]

## 2015-03-21 NOTE — Telephone Encounter (Signed)
See if you can get him to just schedule appt, to discuss, come fasting for labs

## 2015-03-21 NOTE — Telephone Encounter (Signed)
Still no answer on PA for Victoza.  Called [employer] at [phone].  I was told by them that his patient's plan does cover ANY self administered injectable to INCLUDE insulins!!!!!!!  I was advised to have patient call his Benefits office.  [employer] said this patient recently went through some sort of plan change.  The person I spoke to ,Quillian Quince, checked with his supervisor and their supervisor.  They advise patient contact his insurance benefits office.  Trying to reach patient.  So far no answer.

## 2015-03-26 ENCOUNTER — Encounter: Payer: Self-pay | Admitting: Family Medicine

## 2015-03-26 NOTE — Telephone Encounter (Signed)
I was able to reach patient.  He has not started Victoza.  Explained problem with his insurance and injectable meds.  Asked him to call them as well.  He agreed to make appt to come see provider but not available until end of month.

## 2015-03-26 NOTE — Telephone Encounter (Signed)
noted 

## 2015-03-26 NOTE — Telephone Encounter (Signed)
Pt talked to his insurance benefits people.  They said we need to send a letter to [facility] stating we are his PCP and that he needs the Victoza for his diabetic care and then it will be covered.  Letter written and faxed.

## 2015-04-04 ENCOUNTER — Telehealth: Payer: Self-pay | Admitting: Family Medicine

## 2015-04-04 NOTE — Telephone Encounter (Signed)
noted 

## 2015-04-04 NOTE — Telephone Encounter (Signed)
Called patient.  He has been able to get his Victoza.  Apparently letter to his insurance was responded to correctly.  Reminded him to keep appt with provider for end of month.

## 2015-04-20 ENCOUNTER — Ambulatory Visit: Payer: Self-pay | Admitting: Family Medicine

## 2015-05-16 ENCOUNTER — Other Ambulatory Visit: Payer: Self-pay | Admitting: Family Medicine

## 2015-10-02 ENCOUNTER — Other Ambulatory Visit: Payer: Self-pay | Admitting: Family Medicine

## 2015-10-03 ENCOUNTER — Encounter: Payer: Self-pay | Admitting: Family Medicine

## 2015-11-08 ENCOUNTER — Other Ambulatory Visit: Payer: Self-pay

## 2015-11-14 ENCOUNTER — Ambulatory Visit (INDEPENDENT_AMBULATORY_CARE_PROVIDER_SITE_OTHER): Payer: 59 | Admitting: Family Medicine

## 2015-11-14 ENCOUNTER — Encounter: Payer: Self-pay | Admitting: Family Medicine

## 2015-11-14 ENCOUNTER — Other Ambulatory Visit: Payer: Self-pay | Admitting: Family Medicine

## 2015-11-14 VITALS — BP 132/80 | HR 88 | Temp 98.4°F | Resp 16 | Ht 67.0 in | Wt 232.0 lb

## 2015-11-14 DIAGNOSIS — E669 Obesity, unspecified: Secondary | ICD-10-CM

## 2015-11-14 DIAGNOSIS — I10 Essential (primary) hypertension: Secondary | ICD-10-CM

## 2015-11-14 DIAGNOSIS — E1165 Type 2 diabetes mellitus with hyperglycemia: Secondary | ICD-10-CM

## 2015-11-14 DIAGNOSIS — N400 Enlarged prostate without lower urinary tract symptoms: Secondary | ICD-10-CM | POA: Diagnosis not present

## 2015-11-14 DIAGNOSIS — Z0000 Encounter for general adult medical examination without abnormal findings: Secondary | ICD-10-CM

## 2015-11-14 DIAGNOSIS — IMO0001 Reserved for inherently not codable concepts without codable children: Secondary | ICD-10-CM

## 2015-11-14 DIAGNOSIS — N529 Male erectile dysfunction, unspecified: Secondary | ICD-10-CM | POA: Diagnosis not present

## 2015-11-14 DIAGNOSIS — E785 Hyperlipidemia, unspecified: Secondary | ICD-10-CM

## 2015-11-14 LAB — COMPREHENSIVE METABOLIC PANEL
ALT: 19 U/L (ref 9–46)
AST: 19 U/L (ref 10–35)
Albumin: 4.3 g/dL (ref 3.6–5.1)
Alkaline Phosphatase: 65 U/L (ref 40–115)
BUN: 12 mg/dL (ref 7–25)
CO2: 27 mmol/L (ref 20–31)
CREATININE: 1.1 mg/dL (ref 0.70–1.33)
Calcium: 11 mg/dL — ABNORMAL HIGH (ref 8.6–10.3)
Chloride: 104 mmol/L (ref 98–110)
Glucose, Bld: 71 mg/dL (ref 70–99)
Potassium: 4.3 mmol/L (ref 3.5–5.3)
SODIUM: 139 mmol/L (ref 135–146)
TOTAL PROTEIN: 6.8 g/dL (ref 6.1–8.1)
Total Bilirubin: 0.5 mg/dL (ref 0.2–1.2)

## 2015-11-14 LAB — LIPID PANEL
CHOL/HDL RATIO: 3.7 ratio (ref <=5.0)
Cholesterol: 123 mg/dL — ABNORMAL LOW (ref 125–200)
HDL: 33 mg/dL — AB (ref >=40)
LDL Cholesterol: 72 mg/dL (ref <130)
TRIGLYCERIDES: 89 mg/dL (ref <150)
VLDL: 18 mg/dL (ref <30)

## 2015-11-14 LAB — CBC WITH DIFFERENTIAL/PLATELET
BASOS PCT: 1 % (ref 0–1)
Basophils Absolute: 0.1 10*3/uL (ref 0.0–0.1)
Eosinophils Absolute: 0.2 10*3/uL (ref 0.0–0.7)
Eosinophils Relative: 4 % (ref 0–5)
HCT: 45.6 % (ref 39.0–52.0)
Hemoglobin: 14.9 g/dL (ref 13.0–17.0)
Lymphocytes Relative: 40 % (ref 12–46)
Lymphs Abs: 2.1 10*3/uL (ref 0.7–4.0)
MCH: 28.6 pg (ref 26.0–34.0)
MCHC: 32.7 g/dL (ref 30.0–36.0)
MCV: 87.5 fL (ref 78.0–100.0)
MONOS PCT: 9 % (ref 3–12)
MPV: 9.9 fL (ref 8.6–12.4)
Monocytes Absolute: 0.5 10*3/uL (ref 0.1–1.0)
Neutro Abs: 2.4 10*3/uL (ref 1.7–7.7)
Neutrophils Relative %: 46 % (ref 43–77)
Platelets: 233 10*3/uL (ref 150–400)
RBC: 5.21 MIL/uL (ref 4.22–5.81)
RDW: 13.9 % (ref 11.5–15.5)
WBC: 5.3 10*3/uL (ref 4.0–10.5)

## 2015-11-14 MED ORDER — TADALAFIL 5 MG PO TABS: 5.0000 mg | ORAL_TABLET | Freq: Every day | ORAL | Status: DC | PRN

## 2015-11-14 NOTE — Patient Instructions (Addendum)
We will call with  Lab results Continue current medications Work on healthy eating  F/U 3 months

## 2015-11-14 NOTE — Progress Notes (Signed)
[name], male   DOB: 1961-07-03, 55 y.o.   MRN: [id number]    Subjective:    [name], male    DOB: Dec 04, 1960, 55 y.o.   MRN: [id number]  Patient presents for CPE Patient here for complete physical exam. 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 His last A1c a year ago was 8.2% his prescribed medications were metformin, glipizide and Victoza. He is also spell described lisinopril for renal protection And simvastatin  On review of his last set of labs a year ago his vitamin D was very low and his calcium was high concern about parathyroid hormone but he never returned for any follow-up testing  Review Of Systems:  GEN- denies fatigue, fever, weight loss,weakness, recent illness HEENT- denies eye drainage, change in vision, nasal discharge, CVS- denies chest pain, palpitations RESP- denies SOB, cough, wheeze ABD- denies N/V, change in stools, abd pain GU- denies dysuria, hematuria, dribbling, incontinence MSK- denies joint pain, muscle aches, injury Neuro- denies headache, dizziness, syncope, seizure activity       Objective:    BP 132/80 mmHg  Pulse 88  Temp(Src) 98.4 F (36.9 C) (Oral)  Resp 16  Ht 5\' 7"  (1.702 m)  Wt 232 lb (105.235 kg)  BMI 36.33 kg/m2 GEN- NAD, alert and oriented x3 HEENT- PERRL, EOMI, non injected sclera, pink conjunctiva, MMM, oropharynx clear Neck- Supple, no thyromegaly CVS- RRR, no murmur RESP-CTAB ABD-NABS,soft,NT,ND EXT- No edema Pulses- Radial, DP- 2+        Assessment & Plan:      Problem List Items Addressed This Visit    Obesity   Hyperlipidemia    The simvastatin I'll check his lipid panel      Relevant Medications   tadalafil (CIALIS) 5 MG tablet   Other Relevant Orders   Lipid panel   Essential hypertension, benign    His blood pressure looks  okay today we will continue the lisinopril      Relevant Medications   tadalafil (CIALIS) 5 MG tablet   Diabetes type 2, uncontrolled ([place])    Diabetes has been uncontrolled. 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 He has been taking his medications as prescribed and his blood sugars have been fluctuating due to inconsistency with his meds over the past at least 3-4 months. We will check his level today to see what adjustments need to be made. He is due to have his DOT physical next month      Relevant Orders   CBC with Differential/Platelet   Comprehensive metabolic panel   Hemoglobin A1c   Microalbumin / creatinine urine ratio   HM DIABETES FOOT EXAM (Completed)   BPH (benign prostatic hyperplasia)    They have some issues with his BPH is on the Flomax we will see if Cialis will work for both his BPH and his erectile dysfunction      Relevant Orders   PSA    Other Visit Diagnoses    Routine general medical examination at a health care facility    -  Primary    CPE done, immunizations UTD, PSA For prostate cancer screen    Relevant Orders    CBC with Differential/Platelet    Comprehensive metabolic panel    PSA    Hypercalcemia  Relevant Orders    PTH, Intact and Calcium    Erectile dysfunction, unspecified erectile dysfunction type        Relevant Orders    Testosterone       Note: This dictation was prepared with Dragon dictation along with smaller phrase technology. Any transcriptional errors that result from this process are unintentional.

## 2015-11-14 NOTE — Telephone Encounter (Signed)
Refill appropriate and filled per protocol. 

## 2015-11-14 NOTE — Assessment & Plan Note (Signed)
Diabetes has been uncontrolled. 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 He has been taking his medications as prescribed and his blood sugars have been fluctuating due to inconsistency with his meds over the past at least 3-4 months. We will check his level today to see what adjustments need to be made. He is due to have his DOT physical next month

## 2015-11-14 NOTE — Assessment & Plan Note (Signed)
The simvastatin I'll check his lipid panel

## 2015-11-14 NOTE — Assessment & Plan Note (Signed)
His blood pressure looks okay today we will continue the lisinopril

## 2015-11-14 NOTE — Assessment & Plan Note (Signed)
They have some issues with his BPH is on the Flomax we will see if Cialis will work for both his BPH and his erectile dysfunction

## 2015-11-15 LAB — PTH, INTACT AND CALCIUM
CALCIUM: 11 mg/dL — AB (ref 8.4–10.5)
PTH: 130 pg/mL — AB (ref 14–64)

## 2015-11-15 LAB — MICROALBUMIN / CREATININE URINE RATIO
CREATININE, URINE: 257 mg/dL (ref 20–370)
Microalb Creat Ratio: 7 mcg/mg creat (ref <30)
Microalb, Ur: 1.7 mg/dL

## 2015-11-15 LAB — HEMOGLOBIN A1C
HEMOGLOBIN A1C: 10 % — AB (ref <5.7)
Mean Plasma Glucose: 240 mg/dL — ABNORMAL HIGH (ref <117)

## 2015-11-15 LAB — TESTOSTERONE: TESTOSTERONE: 269 ng/dL (ref 250–827)

## 2015-11-15 LAB — PSA: PSA: 2.91 ng/mL (ref <=4.00)

## 2015-11-16 ENCOUNTER — Other Ambulatory Visit: Payer: Self-pay | Admitting: *Deleted

## 2015-11-16 DIAGNOSIS — IMO0001 Reserved for inherently not codable concepts without codable children: Secondary | ICD-10-CM

## 2015-11-16 DIAGNOSIS — E1165 Type 2 diabetes mellitus with hyperglycemia: Secondary | ICD-10-CM

## 2015-11-16 DIAGNOSIS — E213 Hyperparathyroidism, unspecified: Secondary | ICD-10-CM

## 2015-11-19 ENCOUNTER — Telehealth: Payer: Self-pay | Admitting: Family Medicine

## 2015-11-19 LAB — HM DIABETES EYE EXAM

## 2015-11-19 NOTE — Telephone Encounter (Signed)
Patient calling to speak to you regarding his test results and his physical  [phone]

## 2015-11-19 NOTE — Telephone Encounter (Signed)
Call placed to patient.   Reports that he requires documentation for DOT PE.   Advised that [facility] no longer performs DOT PE. Advised that patient will need to go to another office to have DOT PE.  Requested copy of labs. Labs printed and left up front for pick up.

## 2015-11-21 ENCOUNTER — Other Ambulatory Visit: Payer: Self-pay | Admitting: Family Medicine

## 2015-11-21 ENCOUNTER — Other Ambulatory Visit: Payer: Self-pay | Admitting: *Deleted

## 2015-11-21 MED ORDER — ATORVASTATIN CALCIUM 40 MG PO TABS: 40.0000 mg | ORAL_TABLET | Freq: Every day | ORAL | Status: DC

## 2015-11-21 MED ORDER — METFORMIN HCL 1000 MG PO TABS: ORAL_TABLET | ORAL | Status: DC

## 2015-11-21 NOTE — Telephone Encounter (Signed)
Refill appropriate and filled per protocol. 

## 2015-11-21 NOTE — Telephone Encounter (Signed)
Received fax requesting refill on Zocor and MTF.   Advised pharmacy that pt should be on Lipitor, not Zocor.   Prescription sent to pharmacy for MTF.

## 2015-11-28 ENCOUNTER — Ambulatory Visit (INDEPENDENT_AMBULATORY_CARE_PROVIDER_SITE_OTHER): Payer: 59 | Admitting: "Endocrinology

## 2015-11-28 ENCOUNTER — Encounter: Payer: Self-pay | Admitting: "Endocrinology

## 2015-11-28 VITALS — BP 133/83 | HR 94 | Ht 67.0 in | Wt 234.0 lb

## 2015-11-28 DIAGNOSIS — E1165 Type 2 diabetes mellitus with hyperglycemia: Secondary | ICD-10-CM | POA: Diagnosis not present

## 2015-11-28 DIAGNOSIS — IMO0001 Reserved for inherently not codable concepts without codable children: Secondary | ICD-10-CM

## 2015-11-28 DIAGNOSIS — N200 Calculus of kidney: Secondary | ICD-10-CM

## 2015-11-28 DIAGNOSIS — E785 Hyperlipidemia, unspecified: Secondary | ICD-10-CM

## 2015-11-28 DIAGNOSIS — E349 Endocrine disorder, unspecified: Secondary | ICD-10-CM | POA: Diagnosis not present

## 2015-11-28 MED ORDER — EMPAGLIFLOZIN 10 MG PO TABS: 10.0000 mg | ORAL_TABLET | Freq: Every day | ORAL | Status: DC

## 2015-11-28 NOTE — Patient Instructions (Signed)

## 2015-11-28 NOTE — Progress Notes (Signed)
Subjective:    [name], male    DOB: [birth year]. Patient is being seen in consultation for management of diabetes requested by  Vic Blackbird, MD  Past Medical History  Diagnosis Date  . Diabetes mellitus   . Hyperlipidemia   . Hypertension   . BPH (benign prostatic hypertrophy)   . H. pylori infection 12/2011    treated with prevpac   Past Surgical History  Procedure Laterality Date  . Left foot  2004  . Cyst left wrist     Social History   Social History  . Marital Status: Married    Spouse Name: N/A  . Number of Children: 3  . Years of Education: N/A   Occupational History  . truck driver    Social History Main Topics  . Smoking status: Former Smoker -- 1.00 packs/day for 18 years    Types: Cigarettes    Quit date: 12/19/1995  . Smokeless tobacco: Never Used  . Alcohol Use: No  . Drug Use: No  . Sexual Activity: Yes   Other Topics Concern  . None   Social History Narrative   Outpatient Encounter Prescriptions as of 11/28/2015  Medication Sig  . atorvastatin (LIPITOR) 40 MG tablet Take 1 tablet (40 mg total) by mouth daily.  . Liraglutide 18 MG/3ML SOPN Inject 1.8 mg into the skin daily before supper.  Marland Kitchen lisinopril (PRINIVIL,ZESTRIL) 10 MG tablet TAKE 1 TABLET BY MOUTH EVERY DAY  . metFORMIN (GLUCOPHAGE) 1000 MG tablet TAKE 1 TABLET BY MOUTH TWICE A DAY WITH A MEAL  . tadalafil (CIALIS) 5 MG tablet Take 1 tablet (5 mg total) by mouth daily as needed for erectile dysfunction.  . tamsulosin (FLOMAX) 0.4 MG CAPS capsule TAKE ONE CAPSULE BY MOUTH DAILY  . [DISCONTINUED] glipiZIDE (GLUCOTROL) 10 MG tablet TAKE 1 TABLET BY MOUTH TWO TIMES DAILY BEFORE A MEAL  . [DISCONTINUED] VICTOZA 18 MG/3ML SOPN INJECT 1.2MG SUBUCUTANEOUSLY ONCE DAILY  . aspirin 81 MG tablet Take 81 mg by mouth daily.  . B-D ULTRAFINE III SHORT PEN 31G X 8 MM MISC USE AS DIRECTED  . Blood Glucose Monitoring Suppl (BLOOD GLUCOSE METER KIT AND SUPPLIES) KIT Dispense monitor  (#1), lancets (#100), test strips (#100), and alcohol pads (#100) based on patient and insurance preference. Monitor FSBS 2x daily. DX: ICD-9 250.00  . empagliflozin (JARDIANCE) 10 MG TABS tablet Take 10 mg by mouth daily.  Marland Kitchen glucose blood test strip Use as instructed to monitor FSBS 1x daily. Dx: 250.00   No facility-administered encounter medications on file as of 11/28/2015.   ALLERGIES: No Known Allergies VACCINATION STATUS: Immunization History  Administered Date(s) Administered  . Influenza,inj,Quad PF,36+ Mos 05/09/2014  . Pneumococcal Polysaccharide-23 11/02/2013  . Td 08/25/2008  . Tdap 08/25/2008    Diabetes He presents for his initial diabetic visit. He has type 2 diabetes mellitus. Onset time: He was diagnosed at approximate age of 80 years. His disease course has been worsening. There are no hypoglycemic associated symptoms. Pertinent negatives for hypoglycemia include no confusion, headaches, pallor or seizures. Associated symptoms include polydipsia and polyuria. Pertinent negatives for diabetes include no chest pain, no fatigue, no polyphagia and no weakness. There are no hypoglycemic complications. Symptoms are worsening. There are no diabetic complications. Risk factors for coronary artery disease include dyslipidemia, diabetes mellitus, obesity, male sex, sedentary lifestyle and tobacco exposure. Current diabetic treatment includes oral agent (dual therapy) ([place] Victoza 1.2 mg daily.). His weight is increasing steadily. He is  following a generally unhealthy diet. When asked about meal planning, he reported none. He has not had a previous visit with a dietitian. He rarely participates in exercise. Home blood sugar record trend: He does not monitor regularly and he did not bring any meter nor logs. An ACE inhibitor/angiotensin II receptor blocker is being taken.  Hyperlipidemia This is a chronic problem. The current episode started more than 1 year ago. Exacerbating diseases  include diabetes and obesity. Pertinent negatives include no chest pain, myalgias or shortness of breath. Current antihyperlipidemic treatment includes statins. The current treatment provides no improvement of lipids. Compliance problems include adherence to diet.  Risk factors for coronary artery disease include diabetes mellitus, dyslipidemia, hypertension, male sex, obesity and a sedentary lifestyle.  Hypertension This is a chronic problem. The current episode started more than 1 year ago. Pertinent negatives include no chest pain, headaches, neck pain, palpitations or shortness of breath. Risk factors for coronary artery disease include dyslipidemia, diabetes mellitus, obesity, male gender, smoking/tobacco exposure and sedentary lifestyle. Past treatments include ACE inhibitors. The current treatment provides moderate improvement.      Review of Systems  Constitutional: Negative for fever, chills, fatigue and unexpected weight change.  HENT: Negative for dental problem, mouth sores and trouble swallowing.   Eyes: Negative for visual disturbance.  Respiratory: Negative for cough, choking, chest tightness, shortness of breath and wheezing.   Cardiovascular: Negative for chest pain, palpitations and leg swelling.  Gastrointestinal: Negative for nausea, vomiting, abdominal pain, diarrhea, constipation and abdominal distention.  Endocrine: Positive for polydipsia and polyuria. Negative for polyphagia.  Genitourinary: Negative for dysuria, urgency, hematuria and flank pain.  Musculoskeletal: Negative for myalgias, back pain, gait problem and neck pain.  Skin: Negative for pallor, rash and wound.  Neurological: Negative for seizures, syncope, weakness, numbness and headaches.  Psychiatric/Behavioral: Negative.  Negative for confusion and dysphoric mood.    Objective:    BP 133/83 mmHg  Pulse 94  Ht '5\' 7"'  (1.702 m)  Wt 234 lb (106.142 kg)  BMI 36.64 kg/m2  SpO2 96%  Wt Readings from Last 3  Encounters:  11/28/15 234 lb (106.142 kg)  11/14/15 232 lb (105.235 kg)  11/13/14 237 lb (107.502 kg)    Physical Exam  Constitutional: He is oriented to person, place, and time. He appears well-developed and well-nourished. He is cooperative. No distress.  HENT:  Head: Normocephalic and atraumatic.  Eyes: EOM are normal.  Neck: Normal range of motion. Neck supple. No tracheal deviation present. No thyromegaly present.  Cardiovascular: Normal rate, S1 normal, S2 normal and normal heart sounds.  Exam reveals no gallop.   No murmur heard. Pulses:      Dorsalis pedis pulses are 1+ on the right side, and 1+ on the left side.       Posterior tibial pulses are 1+ on the right side, and 1+ on the left side.  Pulmonary/Chest: Breath sounds normal. No respiratory distress. He has no wheezes.  Abdominal: Soft. Bowel sounds are normal. He exhibits no distension. There is no tenderness. There is no guarding and no CVA tenderness.  Musculoskeletal: He exhibits no edema.       Right shoulder: He exhibits no swelling and no deformity.  Neurological: He is alert and oriented to person, place, and time. He has normal strength and normal reflexes. No cranial nerve deficit or sensory deficit. Gait normal.  Skin: Skin is warm and dry. No rash noted. No cyanosis. Nails show no clubbing.  Psychiatric: He has a  normal mood and affect. His speech is normal and behavior is normal. Judgment and thought content normal. Cognition and memory are normal.     CMP ( most recent) CMP     Component Value Date/Time   NA 139 11/14/2015 1108   K 4.3 11/14/2015 1108   CL 104 11/14/2015 1108   CO2 27 11/14/2015 1108   GLUCOSE 71 11/14/2015 1108   BUN 12 11/14/2015 1108   CREATININE 1.10 11/14/2015 1108   CREATININE 1.11 09/18/2009 1546   CALCIUM 11.0* 11/14/2015 1108   CALCIUM 11.0* 11/14/2015 1108   PROT 6.8 11/14/2015 1108   ALBUMIN 4.3 11/14/2015 1108   AST 19 11/14/2015 1108   ALT 19 11/14/2015 1108    ALKPHOS 65 11/14/2015 1108   BILITOT 0.5 11/14/2015 1108   GFRNONAA >60 09/18/2009 1546   GFRAA  09/18/2009 1546    >60        The eGFR has been calculated using the MDRD equation. This calculation has not been validated in all clinical situations. eGFR's persistently <60 mL/min signify possible Chronic Kidney Disease.     Diabetic Labs (most recent): Lab Results  Component Value Date   HGBA1C 10.0* 11/14/2015   HGBA1C 8.2* 11/13/2014   HGBA1C 7.9* 08/11/2014     Lipid Panel ( most recent) Lipid Panel     Component Value Date/Time   CHOL 123* 11/14/2015 1108   TRIG 89 11/14/2015 1108   HDL 33* 11/14/2015 1108   CHOLHDL 3.7 11/14/2015 1108   VLDL 18 11/14/2015 1108   LDLCALC 72 11/14/2015 1108   LDLDIRECT 64 03/18/2013 0901       Assessment & Plan:   1. Uncontrolled type 2 diabetes mellitus without complication, without long-term current use of insulin ([place])  - Patient has currently uncontrolled symptomatic type 2 DM since  55 years of age,  with most recent A1c of 10 %. Recent labs reviewed.   - patient remains at a high risk for more acute and chronic complications of diabetes which include CAD, CVA, CKD, retinopathy, and neuropathy. These are all discussed in detail with the patient.  - I have counseled the patient on diet management and weight loss, by adopting a carbohydrate restricted/protein rich diet.  - Suggestion is made for patient to avoid simple carbohydrates   from their diet including Cakes , Desserts, Ice Cream,  Soda (  diet and regular) , Sweet Tea , Candies,  Chips, Cookies, Artificial Sweeteners,   and "Sugar-free" Products . This will help patient to have stable blood glucose profile and potentially avoid unintended weight gain.  - I encouraged the patient to switch to  unprocessed or minimally processed complex starch and increased protein intake (animal or plant source), fruits, and vegetables.  - Patient is advised to stick to a routine  mealtimes to eat 3 meals  a day and avoid unnecessary snacks ( to snack only to correct hypoglycemia).  - The patient will be scheduled with Jearld Fenton, RDN, CDE for individualized DM education.  - I have approached patient with the following individualized plan to manage diabetes and patient agrees:   - With appropriate education, he has a chance to avoid further escalation of medications for diabetes specifically insulin therapy. -I urged him to continue metformin 1000 g twice a day, discontinue glipizide, add Jardiance 10 mg by mouth every morning, and continue Victoza 1.8 mg daily  - Patient specific target  A1c;  LDL, HDL, Triglycerides, and  Waist Circumference were discussed in detail.  2) BP/HTN: Controlled. Continue current medications including ACEI/ARB. 3) Lipids/HPL:  Controlled, continue statins. 4)  Weight/Diet: CDE Consult will be initiated , exercise, and detailed carbohydrates information provided.  5. Elevated parathyroid hormone/hypercalcemia/history of nephrolithiasis - His most recent PTH 1:30 associated with hypercalcemia of 11 from 11/14/2015. This is indicating off likely primary hyperparathyroidism given the fact that he did have nephrolithiasis in the past. This will warrant further investigation. I will start with repeating PTH/calcium/magnesium/phosphorus and vitamin D level simultaneously and obtaining 24 hour urine calcium. If he is confirmed to have primary hyperparathyroidism , he is a surgical candidate.  6) Chronic Care/Health Maintenance:  -Patient is on ACEI/ARB  (patient complains of dry cough, if this continues to be an issue he will be considered for losartan instead of lisinopril) and Statin medications and encouraged to continue to follow up with Ophthalmology, Podiatrist at least yearly or according to recommendations, and advised to   stay away from smoking. I have recommended yearly flu vaccine and pneumonia vaccination at least every 5 years;  moderate intensity exercise for up to 150 minutes weekly; and  sleep for at least 7 hours a day.  - 60 minutes of time was spent on the care of this patient , 50% of which was applied for counseling on diabetes complications and their preventions.  - Patient to bring meter and  blood glucose logs during their next visit.   - I advised patient to maintain close follow up with Vic Blackbird, MD for primary care needs.  Follow up plan: - Return in about 10 days (around 12/08/2015) for diabetes, high blood pressure, high cholesterol, Hyperparathyroidism, labs today.  Glade Lloyd, MD Phone: [phone]  Fax: [phone]   11/28/2015, 1:54 PM

## 2015-11-29 LAB — PTH, INTACT AND CALCIUM
CALCIUM: 11.3 mg/dL — AB (ref 8.4–10.5)
PTH: 112 pg/mL — AB (ref 14–64)

## 2015-11-29 LAB — VITAMIN D 25 HYDROXY (VIT D DEFICIENCY, FRACTURES): Vit D, 25-Hydroxy: 18 ng/mL — ABNORMAL LOW (ref 30–100)

## 2015-11-29 LAB — PHOSPHORUS: Phosphorus: 2 mg/dL — ABNORMAL LOW (ref 2.5–4.5)

## 2015-11-29 LAB — MAGNESIUM: MAGNESIUM: 1.8 mg/dL (ref 1.5–2.5)

## 2015-11-29 LAB — TSH: TSH: 0.74 m[IU]/L (ref 0.40–4.50)

## 2015-11-29 LAB — T4, FREE: Free T4: 1 ng/dL (ref 0.8–1.8)

## 2015-12-13 ENCOUNTER — Other Ambulatory Visit: Payer: Self-pay | Admitting: Family Medicine

## 2015-12-13 NOTE — Telephone Encounter (Signed)
Refill appropriate and filled per protocol. 

## 2015-12-14 ENCOUNTER — Ambulatory Visit: Payer: 59 | Admitting: "Endocrinology

## 2015-12-17 ENCOUNTER — Other Ambulatory Visit: Payer: Self-pay | Admitting: "Endocrinology

## 2015-12-18 LAB — CALCIUM, URINE, 24 HOUR
CALCIUM 24HR UR: 425 mg/(24.h) — AB (ref 55–300)
CALCIUM UR: 17 mg/dL

## 2015-12-24 ENCOUNTER — Ambulatory Visit: Payer: Self-pay | Admitting: Nutrition

## 2015-12-28 ENCOUNTER — Other Ambulatory Visit: Payer: Self-pay | Admitting: "Endocrinology

## 2015-12-28 ENCOUNTER — Other Ambulatory Visit: Payer: Self-pay | Admitting: Family Medicine

## 2015-12-28 NOTE — Telephone Encounter (Signed)
Refill appropriate and filled per protocol. 

## 2016-01-30 ENCOUNTER — Other Ambulatory Visit: Payer: Self-pay

## 2016-01-30 MED ORDER — LISINOPRIL 10 MG PO TABS: 10.0000 mg | ORAL_TABLET | Freq: Every day | ORAL | Status: DC

## 2016-02-04 ENCOUNTER — Encounter: Payer: Self-pay | Admitting: "Endocrinology

## 2016-02-11 ENCOUNTER — Ambulatory Visit: Payer: 59 | Admitting: "Endocrinology

## 2016-02-18 ENCOUNTER — Encounter: Payer: Self-pay | Admitting: Family Medicine

## 2016-02-18 ENCOUNTER — Ambulatory Visit (INDEPENDENT_AMBULATORY_CARE_PROVIDER_SITE_OTHER): Payer: Self-pay | Admitting: Family Medicine

## 2016-02-18 VITALS — BP 130/78 | HR 82 | Temp 98.1°F | Resp 14 | Ht 67.0 in | Wt 230.0 lb

## 2016-02-18 DIAGNOSIS — I10 Essential (primary) hypertension: Secondary | ICD-10-CM

## 2016-02-18 DIAGNOSIS — E1165 Type 2 diabetes mellitus with hyperglycemia: Secondary | ICD-10-CM

## 2016-02-18 DIAGNOSIS — IMO0001 Reserved for inherently not codable concepts without codable children: Secondary | ICD-10-CM

## 2016-02-18 DIAGNOSIS — E669 Obesity, unspecified: Secondary | ICD-10-CM

## 2016-02-18 DIAGNOSIS — E785 Hyperlipidemia, unspecified: Secondary | ICD-10-CM

## 2016-02-18 LAB — HEMOGLOBIN A1C, FINGERSTICK: Hgb A1C (fingerstick): 8.5 % — ABNORMAL HIGH

## 2016-02-18 NOTE — Assessment & Plan Note (Signed)
Cholesterol has been at goal, no change to lipitor

## 2016-02-18 NOTE — Progress Notes (Signed)
[name], male   DOB: 05/23/1961, 55 y.o.   MRN: [id number]    Subjective:    [name], male    DOB: 12/12/1960, 55 y.o.   MRN: [id number]  Patient presents for 3 month F/U Patient for follow-up. He was referred to endocrinology after her last visit for persistent elevated A1c at 10%. He was taken off of glipizide and started on Jardine's he was continued on Victoza and metformin. Seems he missed his last appt with endocrine due to insurance lapse scheduled to see them next month CBG- last 168, highe 200's, states endocrine told him he did not need to check his blood sugars often so he checks 1-2x/month  His weight is only down 2lbs in last 3 months  Endocrinology also noted he has elevated PTH with history of stones in the past. He was being worked up for this as well.    Review Of Systems:  GEN- denies fatigue, fever, weight loss,weakness, recent illness HEENT- denies eye drainage, change in vision, nasal discharge, CVS- denies chest pain, palpitations RESP- denies SOB, cough, wheeze ABD- denies N/V, change in stools, abd pain GU- denies dysuria, hematuria, dribbling, incontinence MSK- denies joint pain, muscle aches, injury Neuro- denies headache, dizziness, syncope, seizure activity       Objective:    BP 130/78 mmHg  Pulse 82  Temp(Src) 98.1 F (36.7 C) (Oral)  Resp 14  Ht 5\' 7"  (1.702 m)  Wt 230 lb (104.327 kg)  BMI 36.01 kg/m2 GEN- NAD, alert and oriented x3 HEENT- PERRL, EOMI, non injected sclera, pink conjunctiva, MMM, oropharynx clear CVS- RRR, no murmur RESP-CTAB EXT- No edema Pulses- Radial, DP- 2+        Assessment & Plan:      Problem List Items Addressed This Visit    Obesity    Discussed dietary changes, no change to meds      Hyperlipidemia    Cholesterol has been at goal, no change to lipitor      Hypercalcemia   Essential hypertension, benign - Primary    Well controlled,no change to meds      Diabetes type 2, uncontrolled ([place])    He will reschedule with endocrine next month  We will plan to get other labs after his insurance kicks in next month Today A1C - is 8.5% which is a major improvement previously at 10 No change to meds, 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 Any transcriptional errors that result from this process are unintentional.

## 2016-02-18 NOTE — Assessment & Plan Note (Signed)
Discussed dietary changes, no change to meds

## 2016-02-18 NOTE — Patient Instructions (Addendum)
Release of records- [place] exam  We will call with lab results Please reschedule with endocrinology  F/U 4 month

## 2016-02-18 NOTE — Assessment & Plan Note (Addendum)
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

## 2016-02-18 NOTE — Assessment & Plan Note (Signed)
Well controlled, no change to meds 

## 2016-03-04 ENCOUNTER — Telehealth: Payer: Self-pay | Admitting: "Endocrinology

## 2016-03-04 NOTE — Telephone Encounter (Signed)
Jardiance isn't covered and we need to call insurance and tell them why he is on it... didn't know if you had received anything from his insurance?

## 2016-03-04 NOTE — Telephone Encounter (Signed)
Medication approved last week. Notified pt and refaxed approval to [place]

## 2016-03-14 ENCOUNTER — Ambulatory Visit: Payer: 59 | Admitting: "Endocrinology

## 2016-03-30 ENCOUNTER — Other Ambulatory Visit: Payer: Self-pay | Admitting: Family Medicine

## 2016-05-05 ENCOUNTER — Telehealth: Payer: Self-pay | Admitting: Family Medicine

## 2016-05-05 NOTE — Telephone Encounter (Signed)
CB# [phone] Patient called requesting a refill on his victoza and the needles called into [facility] in [place]

## 2016-05-08 MED ORDER — INSULIN PEN NEEDLE 31G X 8 MM MISC: 0 refills | Status: DC

## 2016-05-08 MED ORDER — LIRAGLUTIDE 18 MG/3ML ~~LOC~~ SOPN: PEN_INJECTOR | SUBCUTANEOUS | 5 refills | Status: DC

## 2016-05-08 NOTE — Telephone Encounter (Signed)
Prescription sent to pharmacy.

## 2016-05-09 ENCOUNTER — Ambulatory Visit (INDEPENDENT_AMBULATORY_CARE_PROVIDER_SITE_OTHER): Payer: PRIVATE HEALTH INSURANCE | Admitting: Family Medicine

## 2016-05-09 ENCOUNTER — Encounter: Payer: Self-pay | Admitting: Family Medicine

## 2016-05-09 VITALS — BP 128/68 | HR 82 | Temp 98.4°F | Resp 14 | Ht 67.0 in | Wt 231.0 lb

## 2016-05-09 DIAGNOSIS — Z23 Encounter for immunization: Secondary | ICD-10-CM | POA: Diagnosis not present

## 2016-05-09 DIAGNOSIS — E785 Hyperlipidemia, unspecified: Secondary | ICD-10-CM | POA: Diagnosis not present

## 2016-05-09 DIAGNOSIS — E669 Obesity, unspecified: Secondary | ICD-10-CM | POA: Diagnosis not present

## 2016-05-09 DIAGNOSIS — N529 Male erectile dysfunction, unspecified: Secondary | ICD-10-CM | POA: Insufficient documentation

## 2016-05-09 DIAGNOSIS — E1165 Type 2 diabetes mellitus with hyperglycemia: Secondary | ICD-10-CM

## 2016-05-09 DIAGNOSIS — IMO0001 Reserved for inherently not codable concepts without codable children: Secondary | ICD-10-CM

## 2016-05-09 DIAGNOSIS — M79672 Pain in left foot: Secondary | ICD-10-CM | POA: Diagnosis not present

## 2016-05-09 DIAGNOSIS — I10 Essential (primary) hypertension: Secondary | ICD-10-CM | POA: Diagnosis not present

## 2016-05-09 DIAGNOSIS — R319 Hematuria, unspecified: Secondary | ICD-10-CM

## 2016-05-09 DIAGNOSIS — K4090 Unilateral inguinal hernia, without obstruction or gangrene, not specified as recurrent: Secondary | ICD-10-CM

## 2016-05-09 LAB — COMPREHENSIVE METABOLIC PANEL
ALBUMIN: 4.2 g/dL (ref 3.6–5.1)
ALK PHOS: 77 U/L (ref 40–115)
ALT: 17 U/L (ref 9–46)
AST: 16 U/L (ref 10–35)
BILIRUBIN TOTAL: 0.8 mg/dL (ref 0.2–1.2)
BUN: 9 mg/dL (ref 7–25)
CALCIUM: 10.7 mg/dL — AB (ref 8.6–10.3)
CO2: 29 mmol/L (ref 20–31)
Chloride: 105 mmol/L (ref 98–110)
Creat: 1.15 mg/dL (ref 0.70–1.33)
GLUCOSE: 90 mg/dL (ref 70–99)
Potassium: 4.2 mmol/L (ref 3.5–5.3)
Sodium: 137 mmol/L (ref 135–146)
TOTAL PROTEIN: 6.7 g/dL (ref 6.1–8.1)

## 2016-05-09 LAB — URINALYSIS, ROUTINE W REFLEX MICROSCOPIC
Bilirubin Urine: NEGATIVE
Hgb urine dipstick: NEGATIVE
Ketones, ur: NEGATIVE
LEUKOCYTES UA: NEGATIVE
Nitrite: NEGATIVE
Specific Gravity, Urine: 1.015 (ref 1.001–1.035)
pH: 7 (ref 5.0–8.0)

## 2016-05-09 LAB — CBC WITH DIFFERENTIAL/PLATELET
BASOS PCT: 0 %
Basophils Absolute: 0 cells/uL (ref 0–200)
Eosinophils Absolute: 200 cells/uL (ref 15–500)
Eosinophils Relative: 4 %
HEMATOCRIT: 46 % (ref 38.5–50.0)
HEMOGLOBIN: 15.4 g/dL (ref 13.0–17.0)
LYMPHS ABS: 1950 {cells}/uL (ref 850–3900)
Lymphocytes Relative: 39 %
MCH: 29.2 pg (ref 27.0–33.0)
MCHC: 33.5 g/dL (ref 32.0–36.0)
MCV: 87.3 fL (ref 80.0–100.0)
MONO ABS: 600 {cells}/uL (ref 200–950)
MPV: 9.8 fL (ref 7.5–12.5)
Monocytes Relative: 12 %
Neutro Abs: 2250 cells/uL (ref 1500–7800)
Neutrophils Relative %: 45 %
Platelets: 205 10*3/uL (ref 140–400)
RBC: 5.27 MIL/uL (ref 4.20–5.80)
RDW: 13.7 % (ref 11.0–15.0)
WBC: 5 10*3/uL (ref 3.8–10.8)

## 2016-05-09 LAB — LIPID PANEL
CHOLESTEROL: 125 mg/dL (ref 125–200)
HDL: 37 mg/dL — ABNORMAL LOW (ref >=40)
LDL Cholesterol: 67 mg/dL (ref <130)
Total CHOL/HDL Ratio: 3.4 Ratio (ref <=5.0)
Triglycerides: 103 mg/dL (ref <150)
VLDL: 21 mg/dL (ref <30)

## 2016-05-09 MED ORDER — CIPROFLOXACIN HCL 500 MG PO TABS: 500.0000 mg | ORAL_TABLET | Freq: Two times a day (BID) | ORAL | 0 refills | Status: DC

## 2016-05-09 MED ORDER — NAPROXEN 500 MG PO TABS: 500.0000 mg | ORAL_TABLET | Freq: Two times a day (BID) | ORAL | 0 refills | Status: DC

## 2016-05-09 MED ORDER — SILDENAFIL CITRATE 100 MG PO TABS: 100.0000 mg | ORAL_TABLET | Freq: Every day | ORAL | 1 refills | Status: DC | PRN

## 2016-05-09 NOTE — Patient Instructions (Addendum)
ULtrasound to evaluate for hernia Xray of left heel to evaluate for bone spurs Take antibiotics as prescribed  We will call with lab results Try ICE to heel or heel cups Naprosyn twice a day  Try the viagra F/U 3 Month s Heel Spur A heel spur is a bony growth that forms on the bottom of your heel bone (calcaneus). Heel spurs are common and do not always cause pain. However, heel spurs often cause inflammation in the strong band of tissue that runs underneath the bone of your foot (plantar fascia). When this happens, you may feel pain on the bottom of your foot, near your heel.  CAUSES  The cause of heel spurs is not completely understood. They may be caused by pressure on the heel. Or, they may stem from the muscle attachments (tendons) near the spur pulling on the heel.  RISK FACTORS You may be at risk for a heel spur if you:  Are older than 40.  Are overweight.  Have wear and tear arthritis (osteoarthritis).  Have plantar fascia inflammation. SIGNS AND SYMPTOMS  Some people have heel spurs but no symptoms. If you do have symptoms, they may include:   Pain in the bottom of your heel.  Pain that is worse when you first get out of bed.  Pain that gets worse after walking or standing. DIAGNOSIS  Your health care provider may diagnose a heel spur based on your symptoms and a physical exam. You may also have an X-ray of your foot to check for a bony growth coming from the calcaneus.  TREATMENT Treatment aims to relieve the pain from the heel spur. This may include:  Stretching exercises.  Losing weight.  Wearing specific shoes, inserts, or orthotics for comfort and support.  Wearing splints at night to properly position your feet.  Taking over-the-counter medicine to relieve pain.  Being treated with high-intensity sound waves to break up the heel spur (extracorporeal shock wave therapy).  Getting steroid injections in your heel to reduce swelling and ease pain.  Having  surgery if your heel spur causes long-term (chronic) pain. HOME CARE INSTRUCTIONS   Take medicines only as directed by your health care provider.  Ask your health care provider if you should use ice or cold packs on the painful areas of your heel or foot.  Avoid activities that cause you pain until you recover or as directed by your health care provider.  Stretch before exercising or being physically active.  Wear supportive shoes that fit well as directed by your health care provider. You might need to buy new shoes. Wearing old shoes or shoes that do not fit correctly may not provide the support that you need.  Lose weight if your health care provider thinks you should. This can relieve pressure on your foot that may be causing pain and discomfort. SEEK MEDICAL CARE IF:   Your pain continues or gets worse.   This information is not intended to replace advice given to you by your health care provider. Make sure you discuss any questions you have with your health care provider.   Document Released: 09/17/2005 Document Revised: 09/01/2014 Document Reviewed: 10/12/2013 Elsevier Interactive Patient Education [employer].

## 2016-05-09 NOTE — Progress Notes (Signed)
Subjective:    [name], male    DOB: 1961/01/19, 55 y.o.   MRN: [id number]  Patient presents for Follow-up (is not fasting); Generalized Pain (x2 weeks- lower abd pain radiating through groin, R leg and R foot- standing for long periods exacerbates pain); and Hematuria (x2 weeks- reports that he has noted some blood in urine and some in stool)  1. Small hematuria noted in previous urine, he states a few weeks ago had gross blood in urine in stool and urine. No abdominal pain but has some groin pain in left side. No dysuria. Occasionally strains  colonscopy in 2013, polyp but no large hemorroids noted. No aBD pain, no fever   2. Left heel pain for past few months, worse with standing in one spot, no swelling, no ankle pain, has tried some new inserts in shoes.  3. DM- has not followed up with endocrine last A1C was 8.5%, states CBG are good, range 83-150, no hypoglycemia trying to watch diet out on the road, no weight changes.   4. ED- cialsis not working   meds reviewed     Review Of Systems:  GEN- denies fatigue, fever, weight loss,weakness, recent illness HEENT- denies eye drainage, change in vision, nasal discharge, CVS- denies chest pain, palpitations RESP- denies SOB, cough, wheeze ABD- denies N/V, change in stools, abd pain GU- denies dysuria, hematuria, dribbling, incontinence MSK-+ joint pain, muscle aches, injury Neuro- denies headache, dizziness, syncope, seizure activity       Objective:    BP 128/68 (BP Location: Left Arm, Patient Position: Sitting, Cuff Size: Large)   Pulse 82   Temp 98.4 F (36.9 C) (Oral)   Resp 14   Ht 5\' 7"  (1.702 m)   Wt 231 lb (104.8 kg)   BMI 36.18 kg/m  GEN- NAD, alert and oriented x3 HEENT- PERRL, EOMI, non injected sclera, pink conjunctiva, MMM, oropharynx clear Neck- Supple, no thyromegaly CVS- RRR, no murmur RESP-CTAB ABD-NABS,soft,NT,ND Prostate- smooth, mild TTP mild bogginess, no gross blood, FOBT  neg  GU- left / mon pubis into INguinal swelling mild swelling, no scrotal swelling, no abnormal lymph nodes EXT- No edema, TTP Left heel, no gross deformity , FROM Ankle, foot , NT at plantar fascia  Pulses- Radial, DP- 2+        Assessment & Plan:      Problem List Items Addressed This Visit    Obesity   Hyperlipidemia   Relevant Medications   sildenafil (VIAGRA) 100 MG tablet   Other Relevant Orders   Lipid panel (Completed)   Essential hypertension, benign    Well controlled no changes on ACEI and statin drug       Relevant Medications   sildenafil (VIAGRA) 100 MG tablet   Erectile dysfunction    End of visit states cilais does not work Given script to try viagra       Diabetes type 2, uncontrolled ([place])    Recheck fasting labs today, 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%, 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 His fasting CBG look okay        Relevant Orders   CBC with Differential/Platelet (Completed)   Comprehensive metabolic panel (Completed)   Hemoglobin A1c (Completed)    Other Visit Diagnoses    Hematuria    -  Primary   UA neg, but possible prostatitis, normal PSA a few months ago, culture, start Cipro , BRBPR could have been strain wiht BM, evaluate if returns  Relevant Orders   Urinalysis, Routine w reflex microscopic (not at [facility]) (Completed)   Urine culture (Completed)   Unilateral inguinal hernia without obstruction or gangrene, recurrence not specified       obtain Ultarsound to further evaluate   Need for prophylactic vaccination and inoculation against influenza       Relevant Orders   Flu Vaccine QUAD 36+ mos PF IM (Fluarix & Fluzone Quad PF) (Completed)   Heel pain, left       concern for heel spurs obtain xray of heel, given heel cup, improved pain with standing in office      Note: This dictation was prepared with Dragon dictation along with smaller phrase technology. Any transcriptional errors that result from this process are unintentional.

## 2016-05-10 LAB — HEMOGLOBIN A1C
Hgb A1c MFr Bld: 7.4 % — ABNORMAL HIGH (ref <5.7)
MEAN PLASMA GLUCOSE: 166 mg/dL

## 2016-05-11 ENCOUNTER — Encounter: Payer: Self-pay | Admitting: Family Medicine

## 2016-05-11 LAB — URINE CULTURE

## 2016-05-11 NOTE — Assessment & Plan Note (Signed)
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

## 2016-05-11 NOTE — Assessment & Plan Note (Signed)
End of visit states cilais does not work Given script to try viagra

## 2016-05-11 NOTE — Assessment & Plan Note (Signed)
Well controlled no changes on ACEI and statin drug

## 2016-05-16 ENCOUNTER — Ambulatory Visit: Payer: Self-pay | Admitting: Family Medicine

## 2016-05-29 ENCOUNTER — Ambulatory Visit (HOSPITAL_COMMUNITY): Payer: Self-pay

## 2016-06-02 ENCOUNTER — Ambulatory Visit (HOSPITAL_COMMUNITY)
Admission: RE | Admit: 2016-06-02 | Discharge: 2016-06-02 | Disposition: A | Discharge status: Home / Self Care | Payer: BLUE CROSS/BLUE SHIELD | Source: Ambulatory Visit | Attending: Family Medicine | Admitting: Family Medicine

## 2016-06-02 DIAGNOSIS — R1030 Lower abdominal pain, unspecified: Secondary | ICD-10-CM | POA: Diagnosis not present

## 2016-06-02 DIAGNOSIS — K4090 Unilateral inguinal hernia, without obstruction or gangrene, not specified as recurrent: Secondary | ICD-10-CM | POA: Diagnosis not present

## 2016-06-06 ENCOUNTER — Other Ambulatory Visit: Payer: Self-pay | Admitting: Family Medicine

## 2016-06-12 ENCOUNTER — Other Ambulatory Visit: Payer: Self-pay

## 2016-06-12 MED ORDER — EMPAGLIFLOZIN 10 MG PO TABS: 10.0000 mg | ORAL_TABLET | Freq: Every day | ORAL | 2 refills | Status: DC

## 2016-09-16 ENCOUNTER — Other Ambulatory Visit: Payer: Self-pay | Admitting: Family Medicine

## 2016-10-24 DIAGNOSIS — H5203 Hypermetropia, bilateral: Secondary | ICD-10-CM | POA: Diagnosis not present

## 2016-11-05 ENCOUNTER — Other Ambulatory Visit: Payer: Self-pay | Admitting: "Endocrinology

## 2016-11-07 ENCOUNTER — Other Ambulatory Visit: Payer: Self-pay | Admitting: "Endocrinology

## 2016-12-19 ENCOUNTER — Other Ambulatory Visit: Payer: Self-pay | Admitting: Family Medicine

## 2016-12-22 ENCOUNTER — Encounter: Payer: Self-pay | Admitting: Family Medicine

## 2017-02-03 ENCOUNTER — Other Ambulatory Visit: Payer: Self-pay | Admitting: Family Medicine

## 2017-07-20 ENCOUNTER — Other Ambulatory Visit: Payer: Self-pay | Admitting: Family Medicine

## 2017-09-10 ENCOUNTER — Other Ambulatory Visit: Payer: Self-pay | Admitting: Family Medicine

## 2017-09-10 ENCOUNTER — Other Ambulatory Visit: Payer: Self-pay | Admitting: "Endocrinology

## 2017-12-21 ENCOUNTER — Other Ambulatory Visit: Payer: Self-pay

## 2017-12-21 ENCOUNTER — Ambulatory Visit: Payer: BLUE CROSS/BLUE SHIELD | Admitting: Family Medicine

## 2017-12-21 ENCOUNTER — Encounter: Payer: Self-pay | Admitting: Family Medicine

## 2017-12-21 VITALS — BP 132/70 | HR 80 | Temp 98.1°F | Resp 14 | Ht 67.0 in | Wt 226.0 lb

## 2017-12-21 DIAGNOSIS — B958 Unspecified staphylococcus as the cause of diseases classified elsewhere: Secondary | ICD-10-CM

## 2017-12-21 DIAGNOSIS — L089 Local infection of the skin and subcutaneous tissue, unspecified: Secondary | ICD-10-CM

## 2017-12-21 DIAGNOSIS — E1165 Type 2 diabetes mellitus with hyperglycemia: Secondary | ICD-10-CM | POA: Diagnosis not present

## 2017-12-21 DIAGNOSIS — E349 Endocrine disorder, unspecified: Secondary | ICD-10-CM | POA: Diagnosis not present

## 2017-12-21 DIAGNOSIS — I10 Essential (primary) hypertension: Secondary | ICD-10-CM

## 2017-12-21 LAB — CBC WITH DIFFERENTIAL/PLATELET
BASOS ABS: 73 {cells}/uL (ref 0–200)
BASOS PCT: 1.1 %
EOS ABS: 277 {cells}/uL (ref 15–500)
Eosinophils Relative: 4.2 %
HCT: 44.8 % (ref 38.5–50.0)
Hemoglobin: 15.2 g/dL (ref 13.2–17.1)
Lymphs Abs: 2191 cells/uL (ref 850–3900)
MCH: 28.7 pg (ref 27.0–33.0)
MCHC: 33.9 g/dL (ref 32.0–36.0)
MCV: 84.7 fL (ref 80.0–100.0)
MPV: 10.9 fL (ref 7.5–12.5)
Monocytes Relative: 9.5 %
NEUTROS PCT: 52 %
Neutro Abs: 3432 cells/uL (ref 1500–7800)
PLATELETS: 246 10*3/uL (ref 140–400)
RBC: 5.29 10*6/uL (ref 4.20–5.80)
RDW: 12.7 % (ref 11.0–15.0)
Total Lymphocyte: 33.2 %
WBC: 6.6 10*3/uL (ref 3.8–10.8)
WBCMIX: 627 {cells}/uL (ref 200–950)

## 2017-12-21 MED ORDER — TAMSULOSIN HCL 0.4 MG PO CAPS: 0.4000 mg | ORAL_CAPSULE | Freq: Every day | ORAL | 0 refills | Status: DC

## 2017-12-21 MED ORDER — MUPIROCIN CALCIUM 2 % EX CREA: 1.0000 "application " | TOPICAL_CREAM | Freq: Two times a day (BID) | CUTANEOUS | 0 refills | Status: DC

## 2017-12-21 MED ORDER — SULFAMETHOXAZOLE-TRIMETHOPRIM 800-160 MG PO TABS: 1.0000 | ORAL_TABLET | Freq: Two times a day (BID) | ORAL | 0 refills | Status: DC

## 2017-12-21 MED ORDER — SILDENAFIL CITRATE 100 MG PO TABS: 100.0000 mg | ORAL_TABLET | Freq: Every day | ORAL | 1 refills | Status: DC | PRN

## 2017-12-21 NOTE — Progress Notes (Signed)
Subjective:    [name], male    DOB: September 21, 1960, 57 y.o.   MRN: [id number]  Patient presents for Skin Infection (x weeks- states that he has areas to face, head and abd that has swelling and pus like drainage)   DM- taking victoza, jardiance, metformin    Hyperlipidemia- lipitor 10mg    HTN- Lisinopril as prescribed without any difficulty.  He has had recurrent abscesses on his beard region as well as in his scalp it one on his right lower abdomen.  This is been occurring on and off for the past couple months.  He did not change soap or detergent.  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  He has used rubbing alcohol to clean the area as he stopped shaving and cutting his hair as well.  He currently has 2 large ones on his chin and in the beard region.  He still has 2 scabs in his scalp though they are not draining.  He has not had any fever or chills.  He states that his blood sugar has been up and down he does not have his meter with him today.  He has not followed up with endocrinology.    Review Of Systems:  GEN- denies fatigue, fever, weight loss,weakness, recent illness HEENT- denies eye drainage, change in vision, nasal discharge, CVS- denies chest pain, palpitations RESP- denies SOB, cough, wheeze ABD- denies N/V, change in stools, abd pain GU- denies dysuria, hematuria, dribbling, incontinence MSK- denies joint pain, muscle aches, injury Neuro- denies headache, dizziness, syncope, seizure activity       Objective:    BP 132/70   Pulse 80   Temp 98.1 F (36.7 C) (Oral)   Resp 14   Ht 5\' 7"  (1.702 m)   Wt 226 lb (102.5 kg)   SpO2 97%   BMI 35.40 kg/m  GEN- NAD, alert and oriented x3 HEENT- PERRL, EOMI, non injected sclera, pink conjunctiva, MMM, oropharynx clear Neck- Supple, + LAD CVS- RRR, no murmur RESP-CTAB ABD-NABS,soft,NT,ND Skin- small  scab right lower abdomen, abscess on chin, draining pus, mild erythema, small abscess on right neck in bearded region, few small scattered pustules in beared, 3 scabs in scalp 1 small pustule in scalp EXT- No edema Pulses- Radial 2+        Assessment & Plan:      Problem List Items Addressed This Visit      Unprioritized   Hypercalcemia   Relevant Orders   PTH, Intact and Calcium   Elevated parathyroid hormone   Relevant Orders   PTH, Intact and Calcium   Essential hypertension, benign   Relevant Medications   sildenafil (VIAGRA) 100 MG tablet   Diabetes type 2, uncontrolled ([place]) - Primary    Discussed importance of f/u Will treat what is likley staph infections/possible MRSA with Bactrim and bactroban to face lesions and scalp Antibacterial soap Culture sent   Check labs for renal function, A1C, continue current meds, given sample of Victoza  BP is controlled no changes  REcheck PTH and calcium has not followed up with endocrinology      Relevant Orders   CBC with Differential/Platelet   Comprehensive metabolic panel   Hemoglobin A1c    Other Visit Diagnoses    Staphylococcal infection of skin       Relevant Medications   mupirocin cream (  BACTROBAN) 2 %   sulfamethoxazole-trimethoprim (BACTRIM DS,SEPTRA DS) 800-160 MG tablet   Other Relevant Orders   WOUND CULTURE      Note: This dictation was prepared with Dragon dictation along with smaller phrase technology. Any transcriptional errors that result from this process are unintentional.

## 2017-12-21 NOTE — Patient Instructions (Signed)
F/u 2-3 MONTHS for Physical Take antibiotics

## 2017-12-21 NOTE — Assessment & Plan Note (Signed)
Discussed importance of f/u Will treat what is likley staph infections/possible MRSA with Bactrim and bactroban to face lesions and scalp Antibacterial soap Culture sent   Check labs for renal function, A1C, continue current meds, given sample of Victoza  BP is controlled no changes  REcheck PTH and calcium has not followed up with endocrinology

## 2017-12-22 LAB — COMPREHENSIVE METABOLIC PANEL
AG Ratio: 1.2 (calc) (ref 1.0–2.5)
ALBUMIN MSPROF: 3.9 g/dL (ref 3.6–5.1)
ALKALINE PHOSPHATASE (APISO): 91 U/L (ref 40–115)
ALT: 27 U/L (ref 9–46)
AST: 18 U/L (ref 10–35)
BILIRUBIN TOTAL: 0.5 mg/dL (ref 0.2–1.2)
BUN: 10 mg/dL (ref 7–25)
CALCIUM: 11.2 mg/dL — AB (ref 8.6–10.3)
CHLORIDE: 102 mmol/L (ref 98–110)
CO2: 27 mmol/L (ref 20–32)
Creat: 1.31 mg/dL (ref 0.70–1.33)
GLOBULIN: 3.2 g/dL (ref 1.9–3.7)
Glucose, Bld: 183 mg/dL — ABNORMAL HIGH (ref 65–99)
POTASSIUM: 4.3 mmol/L (ref 3.5–5.3)
Sodium: 137 mmol/L (ref 135–146)
Total Protein: 7.1 g/dL (ref 6.1–8.1)

## 2017-12-22 LAB — HEMOGLOBIN A1C

## 2017-12-23 ENCOUNTER — Ambulatory Visit: Payer: PRIVATE HEALTH INSURANCE | Admitting: Family Medicine

## 2017-12-23 LAB — PTH, INTACT AND CALCIUM: CALCIUM: 11.2 mg/dL — AB (ref 8.6–10.3)

## 2017-12-24 LAB — WOUND CULTURE
MICRO NUMBER:: 90519179
SPECIMEN QUALITY:: ADEQUATE

## 2018-01-04 ENCOUNTER — Encounter: Payer: Self-pay | Admitting: Family Medicine

## 2018-01-04 ENCOUNTER — Other Ambulatory Visit: Payer: Self-pay

## 2018-01-04 ENCOUNTER — Ambulatory Visit: Payer: BLUE CROSS/BLUE SHIELD | Admitting: Family Medicine

## 2018-01-04 VITALS — BP 128/68 | HR 68 | Temp 98.3°F | Resp 16 | Ht 67.0 in | Wt 223.0 lb

## 2018-01-04 DIAGNOSIS — A4902 Methicillin resistant Staphylococcus aureus infection, unspecified site: Secondary | ICD-10-CM | POA: Diagnosis not present

## 2018-01-04 DIAGNOSIS — E782 Mixed hyperlipidemia: Secondary | ICD-10-CM

## 2018-01-04 DIAGNOSIS — E1165 Type 2 diabetes mellitus with hyperglycemia: Secondary | ICD-10-CM

## 2018-01-04 MED ORDER — LISINOPRIL 10 MG PO TABS: 10.0000 mg | ORAL_TABLET | Freq: Every day | ORAL | 3 refills | Status: DC

## 2018-01-04 MED ORDER — METFORMIN HCL 1000 MG PO TABS: 1000.0000 mg | ORAL_TABLET | Freq: Two times a day (BID) | ORAL | 2 refills | Status: DC

## 2018-01-04 MED ORDER — LIRAGLUTIDE 18 MG/3ML ~~LOC~~ SOPN: PEN_INJECTOR | SUBCUTANEOUS | 3 refills | Status: DC

## 2018-01-04 MED ORDER — EMPAGLIFLOZIN 10 MG PO TABS: 10.0000 mg | ORAL_TABLET | Freq: Every day | ORAL | 2 refills | Status: DC

## 2018-01-04 MED ORDER — ATORVASTATIN CALCIUM 40 MG PO TABS: 40.0000 mg | ORAL_TABLET | Freq: Every day | ORAL | 2 refills | Status: DC

## 2018-01-04 MED ORDER — TAMSULOSIN HCL 0.4 MG PO CAPS: 0.4000 mg | ORAL_CAPSULE | Freq: Every day | ORAL | 2 refills | Status: DC

## 2018-01-04 NOTE — Assessment & Plan Note (Signed)
Uncontrolled DM, the importance of taking his medications as prescribed and regularly.  Also discussed the diet.  He admits to significant nonadherence to his medication and stopping altogether at one point.  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  His fasting blood sugars the past few days have been much improved have ranged from 1 44-1 70.  Continue Jardiance, metformin and Victoza.  He is also on ACE inhibitor and statin drug however cholesterol is not going to be checked today as he had recently eaten high carb/fatty meal.

## 2018-01-04 NOTE — Patient Instructions (Addendum)
Take all 3 medications be consistent  Use the cream Call Dr. Dorris Fetch for your calcium levels F/U 3 months for PHYSICAL - last week of July

## 2018-01-04 NOTE — Progress Notes (Signed)
   Subjective:    [name], male    DOB: 1961-06-06, 57 y.o.   MRN: [id number]  Patient presents for Follow-up (is not fasting) Pt here to f/u diabetes A1C returned at  >14%    He stopped all medications for about 3 weeks when his face broke out  concerned it was the diabetes meds, he had checked his sugar and would not read on the meter  Eats breakfast- most morning eggs,bacon/frits Biscuits, now trying to change his diet, was eating more sweets junk food  Also states if his sugar was good in the morning he would not take the [place]:  GEN- denies fatigue, fever, weight loss,weakness, recent illness HEENT- denies eye drainage, change in vision, nasal discharge, CVS- denies chest pain, palpitations RESP- denies SOB, cough, wheeze ABD- denies N/V, change in stools, abd pain GU- denies dysuria, hematuria, dribbling, incontinence MSK- denies joint pain, muscle aches, injury Neuro- denies headache, dizziness, syncope, seizure activity       Objective:    BP 128/68   Pulse 68   Temp 98.3 F (36.8 C) (Oral)   Resp 16   Ht 5\' 7"  (1.702 m)   Wt 223 lb (101.2 kg)   SpO2 96%   BMI 34.93 kg/m  GEN- NAD, alert and oriented x3 Skin- no open lesion on face,healed previous abcess        Assessment & Plan:      Problem List Items Addressed This Visit      Unprioritized   Hyperlipidemia   Relevant Medications   lisinopril (PRINIVIL,ZESTRIL) 10 MG tablet   atorvastatin (LIPITOR) 40 MG tablet   Diabetes type 2, uncontrolled ([facility]) - Primary    Uncontrolled DM, the importance of taking his medications as prescribed and regularly.  Also discussed the diet.  He admits to significant nonadherence to his medication and stopping altogether at one point.  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  His fasting blood sugars the past few days have been much improved have ranged from 1 44-1 70.  Continue  Jardiance, metformin and Victoza.  He is also on ACE inhibitor and statin drug however cholesterol is not going to be checked today as he had recently eaten high carb/fatty meal.      Relevant Medications   liraglutide (VICTOZA) 18 MG/3ML SOPN   empagliflozin (JARDIANCE) 10 MG TABS tablet   lisinopril (PRINIVIL,ZESTRIL) 10 MG tablet   atorvastatin (LIPITOR) 40 MG tablet   metFORMIN (GLUCOPHAGE) 1000 MG tablet    Other Visit Diagnoses    MRSA infection       Is to apply the Bactroban as prescribed.  Also to call his endocrinologist for his hypercalcemia      Note: This dictation was prepared with Dragon dictation along with smaller phrase technology. Any transcriptional errors that result from this process are unintentional.

## 2018-03-22 ENCOUNTER — Encounter: Payer: PRIVATE HEALTH INSURANCE | Admitting: Family Medicine

## 2018-04-02 ENCOUNTER — Encounter: Payer: PRIVATE HEALTH INSURANCE | Admitting: Family Medicine

## 2018-04-19 ENCOUNTER — Encounter: Payer: Self-pay | Admitting: Physician Assistant

## 2018-04-19 ENCOUNTER — Ambulatory Visit (INDEPENDENT_AMBULATORY_CARE_PROVIDER_SITE_OTHER): Payer: BLUE CROSS/BLUE SHIELD | Admitting: Physician Assistant

## 2018-04-19 ENCOUNTER — Other Ambulatory Visit: Payer: Self-pay

## 2018-04-19 VITALS — BP 122/64 | HR 64 | Temp 98.1°F | Resp 12 | Ht 67.0 in | Wt 234.0 lb

## 2018-04-19 DIAGNOSIS — I10 Essential (primary) hypertension: Secondary | ICD-10-CM | POA: Diagnosis not present

## 2018-04-19 DIAGNOSIS — E1165 Type 2 diabetes mellitus with hyperglycemia: Secondary | ICD-10-CM | POA: Diagnosis not present

## 2018-04-19 DIAGNOSIS — E785 Hyperlipidemia, unspecified: Secondary | ICD-10-CM

## 2018-04-19 DIAGNOSIS — Z1159 Encounter for screening for other viral diseases: Secondary | ICD-10-CM

## 2018-04-19 DIAGNOSIS — Z114 Encounter for screening for human immunodeficiency virus [HIV]: Secondary | ICD-10-CM

## 2018-04-19 DIAGNOSIS — Z0000 Encounter for general adult medical examination without abnormal findings: Secondary | ICD-10-CM

## 2018-04-19 NOTE — Progress Notes (Signed)
[name] MRN: [id number], DOB: Dec 28, 1960 57 y.o. Date of Encounter: 04/19/2018, 3:15 PM    Chief Complaint: Physical (CPE)  HPI: 57 y.o. y/o male here for CPE.    He usually sees Dr. Buelah Manis as his routine PCP. However, secondary to her maternity leave/schedule, he was scheduled for his CPE with me today. Prior to my visit with him, I did review his last office note with Dr. Buelah Manis on 01/04/2018.  Also reviewed his CPE note from 10/2015.  Today he reports that he is taking all of his medications as listed/as directed. 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  However he states that he has improved his diet and is eating healthier than he did in the past.  Also he reports that he does get some exercise and does some walking.  States that they are limited the number of hours that they can drive per day and that when he is out on the road he does walk during his breaks.  During visit also did a foot exam and he reports that he has been educated regarding diabetic foot care.  He also reports that he does have eye exam once a year in [place].  He is not sure of the name of the practice right now.  He has no specific concerns to address today. He is not fasting today but feels quite certain that he will return fasting within the next 1 to 2 weeks for labs.     Review of Systems: Consitutional: No fever, chills, fatigue, night sweats, lymphadenopathy, or weight changes. Eyes: No visual changes, eye redness, or discharge. ENT/Mouth: Ears: No otalgia, tinnitus, hearing loss, discharge. Nose: No congestion, rhinorrhea, sinus pain, or epistaxis. Throat: No sore throat, post nasal drip, or teeth pain. Cardiovascular: No CP, palpitations, diaphoresis, DOE, edema, orthopnea, PND. Respiratory: No cough, hemoptysis, SOB, or wheezing. Gastrointestinal: No anorexia, dysphagia, reflux, pain, nausea, vomiting, hematemesis, diarrhea, constipation, BRBPR, or  melena. Genitourinary: No dysuria, frequency, urgency, hematuria, incontinence, nocturia, decreased urinary stream, discharge, impotence, or testicular pain/masses. Musculoskeletal: No decreased ROM, myalgias, stiffness, joint swelling, or weakness. Skin: No rash, erythema, lesion changes, pain, warmth, jaundice, or pruritis. Neurological: No headache, dizziness, syncope, seizures, tremors, memory loss, coordination problems, or paresthesias. Psychological: No anxiety, depression, hallucinations, SI/HI. Endocrine: No fatigue, polydipsia, polyphagia, polyuria, or known diabetes. All other systems were reviewed and are otherwise negative.  Past Medical History:  Diagnosis Date  . BPH (benign prostatic hypertrophy)   . Diabetes mellitus   . H. pylori infection 12/2011   treated with prevpac  . Hyperlipidemia   . Hypertension      Past Surgical History:  Procedure Laterality Date  . cyst left wrist    . left foot  2004    Home Meds:  Outpatient Medications Prior to Visit  Medication Sig Dispense Refill  . aspirin 81 MG tablet Take 81 mg by mouth daily.    Marland Kitchen atorvastatin (LIPITOR) 40 MG tablet Take 1 tablet (40 mg total) by mouth daily. 90 tablet 2  . Blood Glucose Monitoring Suppl (BLOOD GLUCOSE METER KIT AND SUPPLIES) KIT Dispense monitor (#1), lancets (#100), test strips (#100), and alcohol pads (#100) based on patient and insurance preference. Monitor FSBS 2x daily. DX: ICD-9 250.00 1 each 0  . empagliflozin (JARDIANCE) 10 MG TABS tablet Take 10 mg by mouth daily. 90 tablet 2  . glucose blood test strip Use as instructed to monitor FSBS 1x daily. Dx:  250.00 100 each 12  . Insulin Pen Needle (B-D ULTRAFINE III SHORT PEN) 31G X 8 MM MISC USE AS DIRECTED 100 each 0  . liraglutide (VICTOZA) 18 MG/3ML SOPN INJECT 1.2 MG SUBCUTANEOUSLY ONCE DAILY. 9 pen 3  . lisinopril (PRINIVIL,ZESTRIL) 10 MG tablet Take 1 tablet (10 mg total) by mouth daily. 90 tablet 3  . metFORMIN (GLUCOPHAGE) 1000 MG  tablet Take 1 tablet (1,000 mg total) by mouth 2 (two) times daily with a meal. 180 tablet 2  . tamsulosin (FLOMAX) 0.4 MG CAPS capsule Take 1 capsule (0.4 mg total) by mouth daily. 90 capsule 2   No facility-administered medications prior to visit.     Allergies: No Known Allergies  Social History   Socioeconomic History  . Marital status: Married    Spouse name: Not on file  . Number of children: 3  . Years of education: Not on file  . Highest education level: Not on file  Occupational History  . Occupation: truck Animator Needs  . Financial resource strain: Not on file  . Food insecurity:    Worry: Not on file    Inability: Not on file  . Transportation needs:    Medical: Not on file    Non-medical: Not on file  Tobacco Use  . Smoking status: Former Smoker    Packs/day: 1.00    Years: 18.00    Pack years: 18.00    Types: Cigarettes    Last attempt to quit: 12/19/1995    Years since quitting: 22.3  . Smokeless tobacco: Never Used  Substance and Sexual Activity  . Alcohol use: No  . Drug use: No  . Sexual activity: Yes  Lifestyle  . Physical activity:    Days per week: Not on file    Minutes per session: Not on file  . Stress: Not on file  Relationships  . Social connections:    Talks on phone: Not on file    Gets together: Not on file    Attends religious service: Not on file    Active member of club or organization: Not on file    Attends meetings of clubs or organizations: Not on file    Relationship status: Not on file  . Intimate partner violence:    Fear of current or ex partner: Not on file    Emotionally abused: Not on file    Physically abused: Not on file    Forced sexual activity: Not on file  Other Topics Concern  . Not on file  Social History Narrative  . Not on file    Family History  Problem Relation Age of Onset  . Breast cancer Mother   . Hypertension Father   . Colon polyps Brother   . Colon cancer Paternal Uncle      Physical Exam: Blood pressure 122/64, pulse 64, temperature 98.1 F (36.7 C), temperature source Oral, resp. rate 12, height '5\' 7"'$  (1.702 m), weight 106.1 kg, SpO2 98 %.  General: Well developed, well nourished AAM. Appears in no acute distress. HEENT: Normocephalic, atraumatic. Conjunctiva pink, sclera non-icteric. Pupils 2 mm constricting to 1 mm, round, regular, and equally reactive to light and accomodation. EOMI. Internal auditory canal clear. TMs with good cone of light and without pathology. Nasal mucosa pink. Nares are without discharge. No sinus tenderness. Oral mucosa pink. Neck: Supple. Trachea midline. No thyromegaly. Full ROM. No lymphadenopathy. No carotid bruit. Lungs: Clear to auscultation bilaterally without wheezes, rales, or rhonchi. Breathing is of  normal effort and unlabored. Cardiovascular: RRR with S1 S2. No murmurs, rubs, or gallops. Distal pulses 2+ symmetrically. No carotid or abdominal bruits. Abdomen: Soft, non-tender, non-distended with normoactive bowel sounds. No hepatosplenomegaly or masses. No rebound/guarding. No CVA tenderness. No hernias. Musculoskeletal: Full range of motion and 5/5 strength throughout.  Skin: Warm and moist without erythema, ecchymosis, wounds, or rash. Neuro: A+Ox3. CN II-XII grossly intact. Moves all extremities spontaneously. Full sensation throughout. Normal gait.  Psych:  Responds to questions appropriately with a normal affect.   Assessment/Plan:  57 y.o. y/o  male here for CPE  1. Encounter for preventive health examination  A. Screening Labs: - CBC with Differential/Platelet; Future - COMPLETE METABOLIC PANEL WITH GFR; Future - Lipid panel; Future - Hepatitis C antibody; Future - HIV antibody; Future - PSA; Future  B. Screening For Prostate Cancer: - PSA; Future  C. Screening For Colorectal Cancer:  I have reviewed chart.  See that she had a colonoscopy in 2013 with Dr. Gala Romney.  However, I am unable to find the report  and when needs repeat. I have written this down on his AVS today.  He is to call Dr. Leitha Schuller office and find out when the next colonoscopy is due.  Reviewed with patient he voices understanding and agrees.  D. Immunizations: Flu----------------------N/A Tetanus---------- last tetanus was 08/25/2008.  This is currently up-to-date but will be due next year.  Discussed with patient. Pneumococcal--- received Pneumovax 23 11/02/2013.  No further pneumonia vaccine indicated until age 76. Shingrix----------- has not received shingles vaccine.  I discussed risk versus benefit of this today.  He is to contact his insurance and find out coverage/ cost and if he is agreeable/wants to proceed then will receive this at pharmacy.   2. Essential hypertension, benign Blood Pressure is at goal.  Continue current medications.  Check lab to monitor. - COMPLETE METABOLIC PANEL WITH GFR; Future  3. Uncontrolled type 2 diabetes mellitus with hyperglycemia ([place]) He reports that he is being compliant in using all medications as directed.  Also reports that he has improved his diet and exercise.  He is able to leave a urine sample to check microalbumin today.  He will return fasting for his labs. - Microalbumin, urine - COMPLETE METABOLIC PANEL WITH GFR; Future - Hemoglobin A1c; Future  4. Hyperlipidemia, unspecified hyperlipidemia type He is on Lipitor.  He will return fasting for labs. - COMPLETE METABOLIC PANEL WITH GFR; Future - Lipid panel; Future  5. Screening for HIV (human immunodeficiency virus) - HIV antibody; Future  6. Need for hepatitis C screening test - Hepatitis C antibody; Future   Plan routine follow-up visit 3 months.  Follow-up sooner if needed.  Signed:   [address] [place], [place]  04/19/2018 3:15 PM

## 2018-04-20 ENCOUNTER — Telehealth: Payer: Self-pay | Admitting: Family Medicine

## 2018-04-20 LAB — MICROALBUMIN, URINE: MICROALB UR: 0.6 mg/dL

## 2018-04-20 LAB — HM DIABETES EYE EXAM

## 2018-04-20 NOTE — Telephone Encounter (Signed)
Patient says he is calling to give information regarding an hiv shot? Would like to talk about this [phone]

## 2018-04-22 NOTE — Telephone Encounter (Signed)
Call placed to patient. LMTRC.  

## 2018-04-23 NOTE — Telephone Encounter (Signed)
Multiple calls placed to patient with no answer and no return call.   Message to be closed.  

## 2018-04-23 NOTE — Telephone Encounter (Signed)
Call placed to patient. LMTRC.  

## 2018-04-27 ENCOUNTER — Other Ambulatory Visit: Payer: BLUE CROSS/BLUE SHIELD

## 2018-04-27 ENCOUNTER — Telehealth: Payer: Self-pay | Admitting: *Deleted

## 2018-04-27 DIAGNOSIS — Z114 Encounter for screening for human immunodeficiency virus [HIV]: Secondary | ICD-10-CM

## 2018-04-27 DIAGNOSIS — Z0000 Encounter for general adult medical examination without abnormal findings: Secondary | ICD-10-CM

## 2018-04-27 DIAGNOSIS — I10 Essential (primary) hypertension: Secondary | ICD-10-CM | POA: Diagnosis not present

## 2018-04-27 DIAGNOSIS — E1165 Type 2 diabetes mellitus with hyperglycemia: Secondary | ICD-10-CM | POA: Diagnosis not present

## 2018-04-27 DIAGNOSIS — E785 Hyperlipidemia, unspecified: Secondary | ICD-10-CM

## 2018-04-27 DIAGNOSIS — R0683 Snoring: Secondary | ICD-10-CM

## 2018-04-27 DIAGNOSIS — Z1159 Encounter for screening for other viral diseases: Secondary | ICD-10-CM

## 2018-04-27 NOTE — Telephone Encounter (Signed)
Received call from patient.   Reports that he forgot to mention in OV that he is having increased amounts of snoring. States that wife is concerned and would like sleep study.   Denies fatigue throughout the day or falling asleep easily throughout the day.   Please advise.

## 2018-04-28 LAB — COMPLETE METABOLIC PANEL WITH GFR
AG Ratio: 1.6 (calc) (ref 1.0–2.5)
ALT: 21 U/L (ref 9–46)
AST: 14 U/L (ref 10–35)
Albumin: 4.1 g/dL (ref 3.6–5.1)
Alkaline phosphatase (APISO): 90 U/L (ref 40–115)
BUN/Creatinine Ratio: 9 (calc) (ref 6–22)
BUN: 13 mg/dL (ref 7–25)
CALCIUM: 10.8 mg/dL — AB (ref 8.6–10.3)
CO2: 27 mmol/L (ref 20–32)
CREATININE: 1.37 mg/dL — AB (ref 0.70–1.33)
Chloride: 103 mmol/L (ref 98–110)
GFR, EST NON AFRICAN AMERICAN: 57 mL/min/{1.73_m2} — AB (ref >=60)
GFR, Est African American: 66 mL/min/{1.73_m2} (ref >=60)
GLUCOSE: 203 mg/dL — AB (ref 65–99)
Globulin: 2.5 g/dL (calc) (ref 1.9–3.7)
POTASSIUM: 4.7 mmol/L (ref 3.5–5.3)
Sodium: 137 mmol/L (ref 135–146)
Total Bilirubin: 0.4 mg/dL (ref 0.2–1.2)
Total Protein: 6.6 g/dL (ref 6.1–8.1)

## 2018-04-28 LAB — HEPATITIS C ANTIBODY
Hepatitis C Ab: NONREACTIVE
SIGNAL TO CUT-OFF: 0.04 (ref <1.00)

## 2018-04-28 LAB — HEMOGLOBIN A1C
EAG (MMOL/L): 11.7 (calc)
HEMOGLOBIN A1C: 9 %{Hb} — AB (ref <5.7)
MEAN PLASMA GLUCOSE: 212 (calc)

## 2018-04-28 LAB — LIPID PANEL
CHOLESTEROL: 189 mg/dL (ref <200)
HDL: 36 mg/dL — AB (ref >40)
LDL CHOLESTEROL (CALC): 127 mg/dL — AB
Non-HDL Cholesterol (Calc): 153 mg/dL (calc) — ABNORMAL HIGH (ref <130)
TRIGLYCERIDES: 149 mg/dL (ref <150)
Total CHOL/HDL Ratio: 5.3 (calc) — ABNORMAL HIGH (ref <5.0)

## 2018-04-28 LAB — CBC WITH DIFFERENTIAL/PLATELET
BASOS PCT: 1 %
Basophils Absolute: 49 cells/uL (ref 0–200)
EOS ABS: 211 {cells}/uL (ref 15–500)
Eosinophils Relative: 4.3 %
HCT: 48.1 % (ref 38.5–50.0)
HEMOGLOBIN: 15.8 g/dL (ref 13.2–17.1)
LYMPHS ABS: 1945 {cells}/uL (ref 850–3900)
MCH: 28.7 pg (ref 27.0–33.0)
MCHC: 32.8 g/dL (ref 32.0–36.0)
MCV: 87.5 fL (ref 80.0–100.0)
MONOS PCT: 10.7 %
MPV: 10.8 fL (ref 7.5–12.5)
NEUTROS ABS: 2171 {cells}/uL (ref 1500–7800)
Neutrophils Relative %: 44.3 %
Platelets: 182 10*3/uL (ref 140–400)
RBC: 5.5 10*6/uL (ref 4.20–5.80)
RDW: 12.6 % (ref 11.0–15.0)
Total Lymphocyte: 39.7 %
WBC mixed population: 524 cells/uL (ref 200–950)
WBC: 4.9 10*3/uL (ref 3.8–10.8)

## 2018-04-28 LAB — PSA: PSA: 4.5 ng/mL — ABNORMAL HIGH (ref <=4.0)

## 2018-04-28 LAB — HIV ANTIBODY (ROUTINE TESTING W REFLEX): HIV 1&2 Ab, 4th Generation: NONREACTIVE

## 2018-04-28 NOTE — Telephone Encounter (Signed)
Referral placed.

## 2018-04-28 NOTE — Telephone Encounter (Signed)
Place order for referral for sleep study to [facility] neurology Dr. Brett Fairy.

## 2018-04-28 NOTE — Telephone Encounter (Signed)
Call placed to patient lvm confirming the referral had been placed

## 2018-05-03 ENCOUNTER — Other Ambulatory Visit: Payer: Self-pay

## 2018-05-03 ENCOUNTER — Encounter: Payer: Self-pay | Admitting: *Deleted

## 2018-05-03 DIAGNOSIS — R972 Elevated prostate specific antigen [PSA]: Secondary | ICD-10-CM

## 2018-05-03 MED ORDER — ATORVASTATIN CALCIUM 80 MG PO TABS: 80.0000 mg | ORAL_TABLET | Freq: Every day | ORAL | 2 refills | Status: DC

## 2018-05-03 MED ORDER — EMPAGLIFLOZIN 25 MG PO TABS: 25.0000 mg | ORAL_TABLET | Freq: Every day | ORAL | 5 refills | Status: DC

## 2018-05-03 MED ORDER — PIOGLITAZONE HCL 45 MG PO TABS: 45.0000 mg | ORAL_TABLET | ORAL | 5 refills | Status: DC

## 2018-05-24 ENCOUNTER — Ambulatory Visit (INDEPENDENT_AMBULATORY_CARE_PROVIDER_SITE_OTHER): Payer: BLUE CROSS/BLUE SHIELD

## 2018-05-24 DIAGNOSIS — Z86010 Personal history of colonic polyps: Secondary | ICD-10-CM

## 2018-05-24 MED ORDER — NA SULFATE-K SULFATE-MG SULF 17.5-3.13-1.6 GM/177ML PO SOLN: 1.0000 | ORAL | 0 refills | Status: DC

## 2018-05-24 NOTE — Progress Notes (Signed)
Gastroenterology Pre-Procedure Review  Request Date:05/14/18 Requesting Physician: pt was on recall for 3 year tcs in 2016. Last tcs RMR- 01/05/12- tubulovillous adenoma  PATIENT REVIEW QUESTIONS: The patient responded to the following health history questions as indicated:    1. Diabetes Melitis: yes (jardiane, metformin, victoza) 2. Joint replacements in the past 12 months: no 3. Major health problems in the past 3 months: no 4. Has an artificial valve or MVP: no 5. Has a defibrillator: no 6. Has been advised in past to take antibiotics in advance of a procedure like teeth cleaning: no 7. Family history of colon cancer: yes (uncle and brother )  40. Alcohol Use: no 9. History of sleep apnea: no getting ready to take sleep study  10. History of coronary artery or other vascular stents placed within the last 12 months: no 11. History of any prior anesthesia complications: no    MEDICATIONS & ALLERGIES:    Patient reports the following regarding taking any blood thinners:   Plavix? no Aspirin? yes (40m) Coumadin? no Brilinta? no Xarelto? no Eliquis? no Pradaxa? no Savaysa? no Effient? no  Patient confirms/reports the following medications:  Current Outpatient Medications  Medication Sig Dispense Refill  . aspirin 81 MG tablet Take 81 mg by mouth daily.    .Marland Kitchenatorvastatin (LIPITOR) 80 MG tablet Take 1 tablet (80 mg total) by mouth daily. 30 tablet 2  . empagliflozin (JARDIANCE) 25 MG TABS tablet Take 25 mg by mouth daily. 30 tablet 5  . liraglutide (VICTOZA) 18 MG/3ML SOPN INJECT 1.2 MG SUBCUTANEOUSLY ONCE DAILY. 9 pen 3  . lisinopril (PRINIVIL,ZESTRIL) 10 MG tablet Take 1 tablet (10 mg total) by mouth daily. 90 tablet 3  . metFORMIN (GLUCOPHAGE) 1000 MG tablet Take 1 tablet (1,000 mg total) by mouth 2 (two) times daily with a meal. 180 tablet 2  . pioglitazone (ACTOS) 45 MG tablet Take 1 tablet (45 mg total) by mouth every morning. 30 tablet 5  . Blood Glucose Monitoring Suppl  (BLOOD GLUCOSE METER KIT AND SUPPLIES) KIT Dispense monitor (#1), lancets (#100), test strips (#100), and alcohol pads (#100) based on patient and insurance preference. Monitor FSBS 2x daily. DX: ICD-9 250.00 1 each 0  . glucose blood test strip Use as instructed to monitor FSBS 1x daily. Dx: 250.00 100 each 12  . Insulin Pen Needle (B-D ULTRAFINE III SHORT PEN) 31G X 8 MM MISC USE AS DIRECTED 100 each 0   No current facility-administered medications for this visit.     Patient confirms/reports the following allergies:  No Known Allergies  No orders of the defined types were placed in this encounter.   AUTHORIZATION INFORMATION Primary Insurance: BC[employer] [place]  ID #: [id number]-Cert / AJosem Kaufmannrequired: no   SCHEDULE INFORMATION: Procedure has been scheduled as follows:  Date: 08/06/18, Time:8:30 Location: [facility] Dr.Rourk  This Gastroenterology Pre-Precedure Review Form is being routed to the following provider(s): EWalden FieldNP

## 2018-05-24 NOTE — Progress Notes (Signed)
Ok to schedule.  DM meds: half night before and none morning of.  On prep day: Check CBG ac and hs as well as if the patient feels like their blood sugar is off. Can use soda, juice (that's in CLEAR LIQUIDS) as needed for any low blood sugar.  Check CBG on arrival to endo unit. 

## 2018-05-24 NOTE — Patient Instructions (Signed)
Scott Perez  07/20/1961 MRN: [id number]     Procedure Date: 08/06/18 Time to register: 7:30am Place to register: [facility] Procedure Time: 8:30am Scheduled provider: R. Garfield Cornea, MD    PREPARATION FOR COLONOSCOPY WITH SUPREP BOWEL PREP KIT  Note: Suprep Bowel Prep Kit is a split-dose (2day) regimen. Consumption of BOTH 6-ounce bottles is required for a complete prep.  Please notify us immediately if you are diabetic, take iron supplements, or if you are on Coumadin or any other blood thinners.  Please hold the following medications: I will mail you a letter with this information                                                                                                                                                   2 DAYS BEFORE PROCEDURE:  DATE: 08/04/18   DAY: Wednesday Begin clear liquid diet AFTER your lunch meal. NO SOLID FOODS after this point.  1 DAY BEFORE PROCEDURE:  DATE: 08/05/18  DAY: Thursday Continue clear liquids the entire day - NO SOLID FOOD.   Diabetic medications adjustments for today: see letter  At 6:00pm: Complete steps 1 through 4 below, using ONE (1) 6-ounce bottle, before going to bed. Step 1:  Pour ONE (1) 6-ounce bottle of SUPREP liquid into the mixing container.  Step 2:  Add cool drinking water to the 16 ounce line on the container and mix.  Note: Dilute the solution concentrate as directed prior to use. Step 3:  DRINK ALL the liquid in the container. Step 4:  You MUST drink an additional two (2) or more 16 ounce containers of water over the next one (1) hour.   Continue clear liquids.  DAY OF PROCEDURE:   DATE: 08/06/18   DAY: Friday If you take medications for your heart, blood pressure, or breathing, you may take these medications.  Diabetic medications adjustments for today: see letter  5 hours before your procedure at :3:30am Step 1:  Pour ONE (1) 6-ounce bottle of SUPREP liquid into the mixing container.  Step  2:  Add cool drinking water to the 16 ounce line on the container and mix.  Note: Dilute the solution concentrate as directed prior to use. Step 3:  DRINK ALL the liquid in the container. Step 4:  You MUST drink an additional two (2) or more 16 ounce containers of water over the next one (1) hour. You MUST complete the final glass of water at least 3 hours before your colonoscopy.   Nothing by mouth past 5:30am  You may take your morning medications with sip of water unless we have instructed otherwise.    Please see below for Dietary Information.  CLEAR LIQUIDS INCLUDE:  Water Jello (NOT red in color)   Ice Popsicles (NOT red in color)   Tea (sugar ok, no  milk/cream) Powdered fruit flavored drinks  Coffee (sugar ok, no milk/cream) Gatorade/ Lemonade/ Kool-Aid  (NOT red in color)   Juice: apple, white grape, white cranberry Soft drinks  Clear bullion, consomme, broth (fat free beef/chicken/vegetable)  Carbonated beverages (any kind)  Strained chicken noodle soup Hard Candy   Remember: Clear liquids are liquids that will allow you to see your fingers on the other side of a clear glass. Be sure liquids are NOT red in color, and not cloudy, but CLEAR.  DO NOT EAT OR DRINK ANY OF THE FOLLOWING:  Dairy products of any kind   Cranberry juice Tomato juice / V8 juice   Grapefruit juice Orange juice     Red grape juice  Do not eat any solid foods, including such foods as: cereal, oatmeal, yogurt, fruits, vegetables, creamed soups, eggs, bread, crackers, pureed foods in a blender, etc.   HELPFUL HINTS FOR DRINKING PREP SOLUTION:   Make sure prep is extremely cold. Mix and refrigerate the the morning of the prep. You may also put in the freezer.   You may try mixing some Crystal Light or Country Time Lemonade if you prefer. Mix in small amounts; add more if necessary.  Try drinking through a straw  Rinse mouth with water or a mouthwash between glasses, to remove after-taste.  Try  sipping on a cold beverage /ice/ popsicles between glasses of prep.  Place a piece of sugar-free hard candy in mouth between glasses.  If you become nauseated, try consuming smaller amounts, or stretch out the time between glasses. Stop for 30-60 minutes, then slowly start back drinking.     OTHER INSTRUCTIONS  You will need a responsible adult at least 57 years of age to accompany you and drive you home. This person must remain in the waiting room during your procedure. The hospital will cancel your procedure if you do not have a responsible adult with you.   1. Wear loose fitting clothing that is easily removed. 2. Leave jewelry and other valuables at home.  3. Remove all body piercing jewelry and leave at home. 4. Total time from sign-in until discharge is approximately 2-3 hours. 5. You should go home directly after your procedure and rest. You can resume normal activities the day after your procedure. 6. The day of your procedure you should not:  Drive  Make legal decisions  Operate machinery  Drink alcohol  Return to work   You may call the office (Dept: [phone]) before 5:00pm, or page the doctor on call [phone]) after 5:00pm, for further instructions, if necessary.   Insurance Information YOU WILL NEED TO CHECK WITH YOUR INSURANCE COMPANY FOR THE BENEFITS OF COVERAGE YOU HAVE FOR THIS PROCEDURE.  UNFORTUNATELY, NOT ALL INSURANCE COMPANIES HAVE BENEFITS TO COVER ALL OR PART OF THESE TYPES OF PROCEDURES.  IT IS YOUR RESPONSIBILITY TO CHECK YOUR BENEFITS, HOWEVER, WE WILL BE GLAD TO ASSIST YOU WITH ANY CODES YOUR INSURANCE COMPANY MAY NEED.    PLEASE NOTE THAT MOST INSURANCE COMPANIES WILL NOT COVER A SCREENING COLONOSCOPY FOR PEOPLE UNDER THE AGE OF 50  IF YOU HAVE BCBS INSURANCE, YOU MAY HAVE BENEFITS FOR A SCREENING COLONOSCOPY BUT IF POLYPS ARE FOUND THE DIAGNOSIS WILL CHANGE AND THEN YOU MAY HAVE A DEDUCTIBLE THAT WILL NEED TO BE MET. SO PLEASE MAKE SURE YOU  CHECK YOUR BENEFITS FOR A SCREENING COLONOSCOPY AS WELL AS A DIAGNOSTIC COLONOSCOPY.

## 2018-06-23 ENCOUNTER — Ambulatory Visit: Payer: BLUE CROSS/BLUE SHIELD | Admitting: Urology

## 2018-06-28 ENCOUNTER — Ambulatory Visit (INDEPENDENT_AMBULATORY_CARE_PROVIDER_SITE_OTHER): Payer: BLUE CROSS/BLUE SHIELD | Admitting: Neurology

## 2018-06-28 ENCOUNTER — Encounter: Payer: Self-pay | Admitting: Neurology

## 2018-06-28 VITALS — BP 130/82 | HR 94 | Ht 67.0 in | Wt 235.0 lb

## 2018-06-28 DIAGNOSIS — R0683 Snoring: Secondary | ICD-10-CM

## 2018-06-28 DIAGNOSIS — E349 Endocrine disorder, unspecified: Secondary | ICD-10-CM | POA: Diagnosis not present

## 2018-06-28 DIAGNOSIS — IMO0001 Reserved for inherently not codable concepts without codable children: Secondary | ICD-10-CM | POA: Insufficient documentation

## 2018-06-28 DIAGNOSIS — G4710 Hypersomnia, unspecified: Secondary | ICD-10-CM | POA: Insufficient documentation

## 2018-06-28 DIAGNOSIS — R351 Nocturia: Secondary | ICD-10-CM

## 2018-06-28 DIAGNOSIS — E1165 Type 2 diabetes mellitus with hyperglycemia: Secondary | ICD-10-CM | POA: Diagnosis not present

## 2018-06-28 DIAGNOSIS — G4726 Circadian rhythm sleep disorder, shift work type: Secondary | ICD-10-CM | POA: Insufficient documentation

## 2018-06-28 DIAGNOSIS — N401 Enlarged prostate with lower urinary tract symptoms: Secondary | ICD-10-CM

## 2018-06-28 DIAGNOSIS — I152 Hypertension secondary to endocrine disorders: Secondary | ICD-10-CM | POA: Insufficient documentation

## 2018-06-28 DIAGNOSIS — G4730 Sleep apnea, unspecified: Secondary | ICD-10-CM

## 2018-06-28 DIAGNOSIS — Z794 Long term (current) use of insulin: Secondary | ICD-10-CM

## 2018-06-28 DIAGNOSIS — E118 Type 2 diabetes mellitus with unspecified complications: Secondary | ICD-10-CM

## 2018-06-28 NOTE — Patient Instructions (Signed)

## 2018-06-28 NOTE — Progress Notes (Signed)
SLEEP MEDICINE CLINIC   Provider:  Larey Perez, M.D.   Primary Care Physician:  Scott Rossetti, MD   Referring Provider: Alycia Rossetti, MD   Chief Complaint  Patient presents with  . New Patient (Initial Visit)    pt alone, rm 11. pt states that his wife tells him he snores and he states he dont feel like he gets a good night sleep. states that he wakes up feeling weak and tired and with no energy.     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 Buelah Perez for an evaluation of possible sleep apnea.  Scott Perez wasalready scheduled for a colonoscopy 06-24-2018, but missed his appointment, and there is concern about OSA making him a high risk patient.   Scott Perez reports that he had a colonoscopy at age 17 and there was nothing abnormal found to his knowledge.  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  His diabetes was diagnosed 2-3 years ago, at the same time as his last colonoscopy.  He also has a history of high cholesterol.  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   He also wakes up with headaches, his sleep is fragmented. He feels  numb in the fingertips and in his feet, feels sometimes weak. He snores loudly and has dry mouth,  Nocturia times 6 -10 !!,  skin itching and feels overall sleepier than he should..  Chief complaint according to patient : " I am more sleepy than usual".   Sleep habits are as follows: he rarely sleeps at home- [place]. When he returns home, he can sleep at any time, sleeps between 11 and 6 AM, and naps in mid day. He will have no trouble to sleep after naps at home.  Very frequent bathroom breaks, snoring, reports a 4-5 minute  spell of headaches,  throbbing and dull in the morning. He has felt refreshed enough to start the day. He dreams not a lot, not vivid.   Sleep medical history and family sleep history: no family member with OSA, he has 4 sister , 5 brother - one has prostate cancer, all sisters have DM. Mother had DM.  No tonsillectomy history, TBI, No whiplash, ENT procedures.   Social history:  He stays on the road 2-3 weeks at a time, coming home 2-3 nights. After 11 hours he has to break for 10 hours. He feels he sleeps well I his truck.  Day and night driving alternate. Irregular work hours and time zone changes.  Quit smoking and drinking ETOH 20 years ago. Caffeine - no coffee, tea, sodas Dr. Malachi Perez 4 a day. - and a lot of fruit juice and juice cocktails.      Review of Systems: Out of a complete 14 system review, the patient complains of only the following symptoms, and all other reviewed systems are negative. He also wakes up with headaches, his sleep is fragmented. He feels  numb in the fingertips and in his feet, feels sometimes weak.  He snores loudly and has dry mouth,   Nocturia times 6 -10 !!,  skin itching and feels overall sleepier than he should.Marland Kitchen   Epworth score 3/ 24 ( hard to believe)   , Fatigue severity score 25 / 63 points   , depression score 2/ 15    Social History   Socioeconomic History  . Marital status: Married    Spouse name: Not on file  . Number of children: 3  . Years of education: Not on file  . Highest education level: Not on file  Occupational History  . Occupation: truck Animator Needs  . Financial resource strain: Not on file  . Food insecurity:    Worry: Not on file    Inability: Not on file  . Transportation needs:    Medical: Not on file    Non-medical: Not on file  Tobacco Use  . Smoking status: Former Smoker    Packs/day: 1.00    Years: 18.00    Pack years: 18.00    Types: Cigarettes    Last attempt to quit: 12/19/1995    Years since  quitting: 22.5  . Smokeless tobacco: Never Used  Substance and Sexual Activity  . Alcohol use: No  . Drug use: No  . Sexual activity: Yes  Lifestyle  . Physical activity:    Days per week: Not on file    Minutes per session: Not on file  . Stress: Not on file  Relationships  . Social connections:    Talks on phone: Not on file    Gets together: Not on file    Attends religious service: Not on file    Active member of club or organization: Not on file    Attends meetings of clubs or organizations: Not on file    Relationship status: Not on file  . Intimate partner violence:    Fear of current or ex partner: Not on file    Emotionally abused: Not on file    Physically abused: Not on file    Forced sexual activity: Not on file  Other Topics Concern  . Not on file  Social History Narrative  . Not on file    Family History  Problem Relation Age of Onset  . Breast cancer Mother   . Hypertension Father   . Colon polyps Brother   . Colon cancer Paternal Uncle     Past Medical History:  Diagnosis Date  . BPH (benign prostatic hypertrophy)   . Diabetes mellitus   . H. pylori infection 12/2011   treated with prevpac  . Hyperlipidemia   . Hypertension     Past Surgical History:  Procedure Laterality Date  . cyst left wrist    . left foot  2004    Current Outpatient Medications  Medication Sig Dispense Refill  . aspirin 81 MG tablet Take 81 mg by mouth daily.    Marland Kitchen atorvastatin (LIPITOR) 80 MG tablet Take 1 tablet (80 mg total) by mouth daily. 30 tablet 2  . Blood Glucose Monitoring Suppl (BLOOD GLUCOSE METER KIT AND SUPPLIES) KIT Dispense monitor (#1), lancets (#100), test strips (#100), and alcohol pads (#100) based on patient and insurance preference. Monitor FSBS 2x daily. DX: ICD-9 250.00 1 each 0  . empagliflozin (JARDIANCE) 25 MG TABS tablet Take 25 mg by mouth daily. 30 tablet 5  . glucose blood test strip Use as instructed to monitor FSBS 1x daily. Dx: 250.00 100  each 12  . Insulin Pen Needle (B-D ULTRAFINE III SHORT PEN) 31G X 8 MM MISC USE AS DIRECTED 100  each 0  . liraglutide (VICTOZA) 18 MG/3ML SOPN INJECT 1.2 MG SUBCUTANEOUSLY ONCE DAILY. 9 pen 3  . lisinopril (PRINIVIL,ZESTRIL) 10 MG tablet Take 1 tablet (10 mg total) by mouth daily. 90 tablet 3  . metFORMIN (GLUCOPHAGE) 1000 MG tablet Take 1 tablet (1,000 mg total) by mouth 2 (two) times daily with a meal. 180 tablet 2  . Na Sulfate-K Sulfate-Mg Sulf (SUPREP BOWEL PREP KIT) 17.5-3.13-1.6 GM/177ML SOLN Take 1 kit by mouth as directed. 1 Bottle 0  . pioglitazone (ACTOS) 45 MG tablet Take 1 tablet (45 mg total) by mouth every morning. 30 tablet 5  . tamsulosin (FLOMAX) 0.4 MG CAPS capsule Take 0.4 mg by mouth daily.     No current facility-administered medications for this visit.     Allergies as of 06/28/2018  . (No Known Allergies)    Vitals: BP 130/82   Pulse 94   Ht '5\' 7"'  (1.702 m)   Wt 235 lb (106.6 kg)   BMI 36.81 kg/m  Last Weight:  Wt Readings from Last 1 Encounters:  06/28/18 235 lb (106.6 kg)   LKJ:ZPHX mass index is 36.81 kg/m.     Last Height:   Ht Readings from Last 1 Encounters:  06/28/18 '5\' 7"'  (1.702 m)    Physical exam:  General: The patient is awake, alert and appears not in acute distress. The patient is well groomed. Head: Normocephalic, atraumatic. Neck is supple. Mallampati 5 neck circumference:17. 75  . Nasal airflow congested ,  Retrognathia is mildly seen.  He wears dentures.  Cardiovascular:  Regular rate and rhythm , without  murmurs or carotid bruit, and without distended neck veins. Respiratory: Lungs are clear to auscultation. Skin:  Left ankle edema. Without evidence of rash Trunk: BMI is 36.8. The patient's posture is erect.   Neurologic exam : The patient is awake and alert, oriented to place and time.   Memory subjective  described as intact.   Attention span & concentration ability appears normal.  Speech is fluent,Mood and affect are  appropriate.  Cranial nerves: Pupils are equal and briskly reactive to light. Has new corrective glasses.  Extraocular movements  in vertical and horizontal planes intact and without nystagmus.  Visual fields by finger perimetry are intact. Hearing to finger rub intact.  Facial sensation intact to fine touch.  Facial motor strength is symmetric and tongue and uvula move midline. Shoulder shrug was symmetrical.   Motor exam:   Normal tone, muscle bulk and symmetric strength in all extremities.Sensory:  Fine touch, pinprick and were intact, vibration was diminished at the ankles.  Proprioception tested in the upper extremities was normal. Coordination: Rapid alternating movements in the fingers/hands was normal. Finger-to-nose maneuver  normal without evidence of ataxia, dysmetria or tremor. Gait and station: Patient walks without assistive device and is able unassisted to climb up to the exam table. Strength within normal limits.Stance is stable and normal. Turns with 3 Steps.  Deep tendon reflexes: in the  upper and lower extremities are symmetrically attenuated. Babinski maneuver response is equivocal.   Assessment:  After physical and neurologic examination, review of laboratory studies,  results of pre-existing records as far as provided in visit., my assessment is :  1)  OSA Mr. Kayin Osment is a 57 year old gentleman with multiple risk factors for the presence of obstructive sleep apnea aside from his wife's observations that he snores loudly and irregularly.   There is a body mass index that is elevated and considered morbidly obese, high-grade  Mallampati and neck circumference around 18 inches.  2) In addition he has very severe nocturia and states that he can go up to the bathroom interrupting at sleep to 10 times.  I am concerned that he may have glucosuria. See eating habits. Poor control of DM. HTN also uncontrolled.   3)He spends most of his nights on the road sleeping in his truck  and he has very irregular work hours that would account for him not always sleeping more than 5 hours in warm stretch.  This would account for a shift related circadian free running cycle, another form of a sleep disorder.  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     The patient was advised of the nature of the diagnosed disorder , the treatment options and the  risks for general health and wellness arising from not treating the condition.   I spent more than 45 minutes of face to face time with the patient.  Greater than 50% of time was spent in counseling and coordination of care. We have discussed the diagnosis and differential and I answered the patient's questions.    Plan:  Treatment plan and additional workup : Mr. Walgren should undergo a attended sleep study, a so-called split-night polysomnography.  This would also be done in order to satisfy the DOT.  I would ask him to come in for an overnight sleep study and if apnea is found we will treat him the same night after 2 hours of diagnostic testing time.  My goal is to not just identified the apnea but have it treated as soon as possible.  Aside from possible apnea associated with snoring treatment of apnea may also help with cardiac disease, arrhythmia, diabetes control, hypertension control and may prevent progression of renal disease.  SPLIT night 20 AHI.   Scott Seat, MD 88/12/275, 4:12 PM  Certified in Neurology by ABPN Certified in [place] by [facility] [address], [place], [place] [address]

## 2018-07-26 ENCOUNTER — Ambulatory Visit (INDEPENDENT_AMBULATORY_CARE_PROVIDER_SITE_OTHER): Payer: BLUE CROSS/BLUE SHIELD | Admitting: Neurology

## 2018-07-26 DIAGNOSIS — R351 Nocturia: Secondary | ICD-10-CM

## 2018-07-26 DIAGNOSIS — G4710 Hypersomnia, unspecified: Secondary | ICD-10-CM

## 2018-07-26 DIAGNOSIS — Z794 Long term (current) use of insulin: Secondary | ICD-10-CM

## 2018-07-26 DIAGNOSIS — N401 Enlarged prostate with lower urinary tract symptoms: Secondary | ICD-10-CM

## 2018-07-26 DIAGNOSIS — R0683 Snoring: Secondary | ICD-10-CM

## 2018-07-26 DIAGNOSIS — I152 Hypertension secondary to endocrine disorders: Secondary | ICD-10-CM

## 2018-07-26 DIAGNOSIS — G4733 Obstructive sleep apnea (adult) (pediatric): Secondary | ICD-10-CM | POA: Diagnosis not present

## 2018-07-26 DIAGNOSIS — E118 Type 2 diabetes mellitus with unspecified complications: Secondary | ICD-10-CM

## 2018-07-26 DIAGNOSIS — G4730 Sleep apnea, unspecified: Principal | ICD-10-CM

## 2018-07-26 DIAGNOSIS — IMO0001 Reserved for inherently not codable concepts without codable children: Secondary | ICD-10-CM

## 2018-07-26 DIAGNOSIS — E1165 Type 2 diabetes mellitus with hyperglycemia: Secondary | ICD-10-CM

## 2018-07-26 DIAGNOSIS — E349 Endocrine disorder, unspecified: Secondary | ICD-10-CM

## 2018-07-26 DIAGNOSIS — G4726 Circadian rhythm sleep disorder, shift work type: Secondary | ICD-10-CM

## 2018-08-01 ENCOUNTER — Other Ambulatory Visit: Payer: Self-pay | Admitting: Family Medicine

## 2018-08-02 MED ORDER — INSULIN PEN NEEDLE 31G X 8 MM MISC: 0 refills | Status: DC

## 2018-08-03 NOTE — Procedures (Signed)
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 Buelah Manis for an evaluation of possible sleep apnea.  Mr. Kenyon was already scheduled for a colonoscopy 06-24-2018, but missed his appointment, and there is concern about OSA making him a high risk patient.  Mr. Foskey reports that he had a colonoscopy at age 71 and "nothing abnormal" was found to his knowledge.  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  Diabetes was diagnosed 2-3 years ago, at the same time as his last colonoscopy.  He also has a history of high cholesterol.  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   He also wakes up with headaches, his sleep is fragmented. He snores loudly and has dry mouth, Nocturia times 6 -10 !!,  and feels overall sleepier than he should. The patient endorsed the Epworth Sleepiness Scale at only 3/24 points. The patient's weight 235 pounds with a height of 67 (inches), resulting in a BMI of 37.0 kg/m2.The patient's neck circumference measured 17.8 inches.  CURRENT MEDICATIONS: Aspirin, Lipitor, Jardiance, Victoza, Prinivil, Glucophage, Actos, Flomax.   PROCEDURE:  This is a multichannel digital polysomnogram utilizing the Somnostar 11.2 system.  Electrodes and sensors were applied and monitored per AASM Specifications.   EEG, EOG, Chin and Limb EMG, were sampled at 200 Hz.  ECG, Snore and Nasal Pressure, Thermal Airflow, Respiratory Effort, CPAP Flow and Pressure, Oximetry was sampled at 50 Hz. Digital video and audio were recorded.       BASELINE STUDY WITHOUT CPAP RESULTS: Lights Out was at 22:23 and Lights On at 05:01.  Total recording time (TRT) was 196.5, with a total sleep time (TST) of 182.5 minutes.   The patient's sleep latency was 16.5 minutes.  REM latency was 86 minutes.  The sleep efficiency was 92.9 %.    SLEEP ARCHITECTURE: WASO (Wake after sleep onset) was 3.5 minutes, Stage N1 was 3 minutes, Stage N2 was 125 minutes, Stage N3 was 27.5 minutes and Stage R (REM sleep) was 27 minutes.  The percentages were Stage N1 1.6%, Stage N2 68.5%, Stage N3 15.1% and Stage R (REM sleep) 14.8%.   RESPIRATORY ANALYSIS:  There were a total of 65 respiratory events:  9 obstructive apneas, 0 central apneas and 56 hypopneas. Snoring was noted.    The total APNEA/HYPOPNEA INDEX (AHI) was 21.4 /hour.  25 events occurred in REM sleep and 76 events in NREM. The REM AHI was 55.6 /hour versus a non-REM AHI of 15.4 /hour. The patient spent 307.5 minutes sleep time in the supine position 32 minutes in non-supine. The supine AHI was 24.3 /hour versus a non-supine AHI of 7.6 /hour. OXYGEN SATURATION & C02:  The wake baseline 02 saturation was 92%, with the lowest being 74%. Time spent below 89% saturation equaled 37 minutes.   PERIODIC LIMB MOVEMENTS: The patient had a total of 0 Periodic Limb Movements.  Audio and video analysis did not show any abnormal or unusual movements, behaviors, phonations or vocalizations. The patient took several bathroom breaks. Loud Snoring was noted EKG was in keeping with normal sinus rhythm (NSR)  TITRATION STUDY WITH CPAP RESULTS: CPAP was initiated at 5 cmH20 with heated humidity per AASM split night standards and pressure was advanced to 6/8 cmH20 because of hypopneas, apneas and desaturations.  At a PAP pressure of 9 cmH20, there was a reduction of the AHI to 1.4 /hour.  Total recording time (TRT) was 202 minutes, with a total sleep time (TST) of 156.5 minutes. The patient's sleep latency was 3 minutes. REM  latency was 71 minutes.  The sleep efficiency was 77.5 %.    SLEEP ARCHITECTURE: Wake after sleep was 42 minutes, Stage N1 3.5 minutes, Stage N2 117.5 minutes, Stage N3 9.5 minutes and Stage R (REM sleep) 26 minutes. The percentages were: Stage N1 2.2%, Stage N2 75.1%, Stage N3 6.1% and Stage R (REM sleep) 16.6%.   RESPIRATORY ANALYSIS:  There were a total of 6 respiratory events: 6 hypopneas with a hypopnea index of 2.3 /hour. The total APNEA/HYPOPNEA INDEX (AHI) was 2.3 /hour.  4 events occurred in REM sleep and 2 events in NREM. The REM AHI was 9.2 /hour versus a non-REM AHI of 0.9 /hour. The patient spent 100% of total sleep time in the supine position. The supine AHI was 2.3 /hour, versus a non-supine AHI of 0.0/hour.  OXYGEN SATURATION & C02:  The wake baseline 02 saturation was 92%, with the lowest being 87%. Time spent below 89% saturation equaled 1 minutes.  PERIODIC LIMB MOVEMENTS: The patient had a total of 0 Periodic Limb Movements. The arousals were noted as: 2 were spontaneous, 0 were associated with PLMs, and 0 were associated with respiratory events.  POLYSOMNOGRAPHY IMPRESSION :   1. Moderate degree of REM accentuated Obstructive Sleep Apnea(OSA) with hypoxemia of over 30 minutes, responding to CPAP pressure of 9 cm water.  Carlos placed the patient On a medium size SIMPLUS FFM.   2. Loud Primary Snoring, RERAS noted.   RECOMMENDATIONS: autotitration capable CPAP 6-12 cm water, 2 cm EPR and mask of choice and comfort- the machine will use heated humidity and needs to be compatible with use in a truck cabin.   A follow up appointment will be scheduled in the Sleep Clinic at [facility].     I certify that I have reviewed the entire raw data recording prior to the issuance of this report in accordance with the Standards of Accreditation of the American Academy of Sleep Medicine (AASM)  Larey Seat, M.D.    08-03-2018  Diplomat, American Board of Psychiatry  and Neurology  Diplomat, [place] of Sleep Medicine Medical Director, [place] [facility] at [facility]

## 2018-08-03 NOTE — Addendum Note (Signed)
Addended by: Larey Seat on: 08/03/2018 05:45 PM   Modules accepted: Orders

## 2018-08-04 ENCOUNTER — Telehealth: Payer: Self-pay | Admitting: Neurology

## 2018-08-04 NOTE — Telephone Encounter (Signed)
I called pt. I advised pt that Dr. Brett Fairy reviewed their sleep study results and found that pt has sleep apnea. Dr. Brett Fairy recommends that pt auto CPAP 6-12 cm water pressure. I reviewed PAP compliance expectations with the pt. Pt is agreeable to starting a CPAP. I advised pt that an order will be sent to a DME, [facility], and [facility] will call the pt within about one week after they file with the pt's insurance. [facility] will show the pt how to use the machine, fit for masks, and troubleshoot the CPAP if needed. A follow up appt was made for insurance purposes with Cecille Rubin, NP on Mar 2,2020 at 1:45 pm. Pt verbalized understanding to arrive 15 minutes early and bring their CPAP. A letter with all of this information in it will be mailed to the pt as a reminder. I verified with the pt that the address we have on file is correct. Pt verbalized understanding of results. Pt had no questions at this time but was encouraged to call back if questions arise. I have sent the order to [facility] and have received confirmation that they have received the order.

## 2018-08-04 NOTE — Telephone Encounter (Signed)
-----   Message from Larey Seat, MD sent at 08/03/2018  5:45 PM EST ----- POLYSOMNOGRAPHY IMPRESSION :   1. Moderate degree of REM accentuated Obstructive Sleep  Apnea(OSA) with hypoxemia of over 30 minutes, responding to CPAP  pressure of 9 cm water. Carlos placed the patient On a medium  size SIMPLUS FFM.  2. Loud Primary Snoring, RERAS noted.   RECOMMENDATIONS: autotitration capable CPAP 6-12 cm water, 2 cm  EPR and mask of choice and comfort- the machine will use heated  humidity and needs to be compatible with use in a truck cabin.   A follow up appointment will be scheduled in the Sleep Clinic at  [facility].    I certify that I have reviewed the entire raw data recording  prior to the issuance of this report in accordance with the  Standards of Accreditation of the [place] [employer] Sleep  Medicine (AASM)  Larey Seat, M.D.  08-03-2018

## 2018-08-06 ENCOUNTER — Other Ambulatory Visit: Payer: Self-pay

## 2018-08-06 ENCOUNTER — Ambulatory Visit (HOSPITAL_COMMUNITY)
Admission: RE | Admit: 2018-08-06 | Discharge: 2018-08-06 | Disposition: A | Discharge status: Home / Self Care | Payer: BLUE CROSS/BLUE SHIELD | Source: Ambulatory Visit | Attending: Internal Medicine | Admitting: Internal Medicine

## 2018-08-06 ENCOUNTER — Encounter (HOSPITAL_COMMUNITY): Payer: Self-pay | Admitting: *Deleted

## 2018-08-06 ENCOUNTER — Encounter (HOSPITAL_COMMUNITY)
Admission: RE | Disposition: A | Discharge status: Home / Self Care | Payer: Self-pay | Source: Ambulatory Visit | Attending: Internal Medicine

## 2018-08-06 DIAGNOSIS — Z7982 Long term (current) use of aspirin: Secondary | ICD-10-CM | POA: Diagnosis not present

## 2018-08-06 DIAGNOSIS — I10 Essential (primary) hypertension: Secondary | ICD-10-CM | POA: Diagnosis not present

## 2018-08-06 DIAGNOSIS — Z86010 Personal history of colonic polyps: Secondary | ICD-10-CM | POA: Insufficient documentation

## 2018-08-06 DIAGNOSIS — Z87891 Personal history of nicotine dependence: Secondary | ICD-10-CM | POA: Insufficient documentation

## 2018-08-06 DIAGNOSIS — Z7984 Long term (current) use of oral hypoglycemic drugs: Secondary | ICD-10-CM | POA: Insufficient documentation

## 2018-08-06 DIAGNOSIS — E785 Hyperlipidemia, unspecified: Secondary | ICD-10-CM | POA: Diagnosis not present

## 2018-08-06 DIAGNOSIS — Z1211 Encounter for screening for malignant neoplasm of colon: Secondary | ICD-10-CM | POA: Insufficient documentation

## 2018-08-06 DIAGNOSIS — Z79899 Other long term (current) drug therapy: Secondary | ICD-10-CM | POA: Insufficient documentation

## 2018-08-06 DIAGNOSIS — E119 Type 2 diabetes mellitus without complications: Secondary | ICD-10-CM | POA: Insufficient documentation

## 2018-08-06 DIAGNOSIS — Z8249 Family history of ischemic heart disease and other diseases of the circulatory system: Secondary | ICD-10-CM | POA: Insufficient documentation

## 2018-08-06 DIAGNOSIS — G4730 Sleep apnea, unspecified: Secondary | ICD-10-CM | POA: Insufficient documentation

## 2018-08-06 DIAGNOSIS — K5909 Other constipation: Secondary | ICD-10-CM | POA: Insufficient documentation

## 2018-08-06 DIAGNOSIS — N400 Enlarged prostate without lower urinary tract symptoms: Secondary | ICD-10-CM | POA: Diagnosis not present

## 2018-08-06 HISTORY — DX: Sleep apnea, unspecified: G47.30

## 2018-08-06 HISTORY — DX: Personal history of urinary calculi: Z87.442

## 2018-08-06 HISTORY — PX: COLONOSCOPY: SHX5424

## 2018-08-06 LAB — GLUCOSE, CAPILLARY: Glucose-Capillary: 136 mg/dL — ABNORMAL HIGH (ref 70–99)

## 2018-08-06 SURGERY — COLONOSCOPY
Anesthesia: Moderate Sedation

## 2018-08-06 MED ORDER — MIDAZOLAM HCL 5 MG/5ML IJ SOLN
INTRAMUSCULAR | Status: AC
  Filled 2018-08-06: qty 10

## 2018-08-06 MED ORDER — STERILE WATER FOR IRRIGATION IR SOLN
Status: DC | PRN
  Administered 2018-08-06: 09:00:00

## 2018-08-06 MED ORDER — ONDANSETRON HCL 4 MG/2ML IJ SOLN
INTRAMUSCULAR | Status: AC
  Filled 2018-08-06: qty 2

## 2018-08-06 MED ORDER — SODIUM CHLORIDE 0.9 % IV SOLN
INTRAVENOUS | Status: DC
  Administered 2018-08-06: 1000 mL via INTRAVENOUS

## 2018-08-06 MED ORDER — MEPERIDINE HCL 50 MG/ML IJ SOLN
INTRAMUSCULAR | Status: AC
  Filled 2018-08-06: qty 1

## 2018-08-06 MED ORDER — ONDANSETRON HCL 4 MG/2ML IJ SOLN
INTRAMUSCULAR | Status: DC | PRN
  Administered 2018-08-06: 4 mg via INTRAVENOUS

## 2018-08-06 MED ORDER — MEPERIDINE HCL 100 MG/ML IJ SOLN
INTRAMUSCULAR | Status: DC | PRN
  Administered 2018-08-06: 25 mg

## 2018-08-06 MED ORDER — MIDAZOLAM HCL 5 MG/5ML IJ SOLN
INTRAMUSCULAR | Status: DC | PRN
  Administered 2018-08-06 (×3): 1 mg via INTRAVENOUS
  Administered 2018-08-06: 2 mg via INTRAVENOUS

## 2018-08-06 NOTE — H&P (Signed)
'@LOGO' @   Primary Care Physician:  Alycia Rossetti, MD Primary Gastroenterologist:  Dr. Gala Romney  Pre-Procedure History & Physical: HPI:  Scott Perez is a 57 y.o. male here for surveillance colonoscopy.  History of advanced adenoma removed from his colon 2013.  Only GI symptom these days is chronic constipation.  Past Medical History:  Diagnosis Date  . BPH (benign prostatic hypertrophy)   . Diabetes mellitus   . H. pylori infection 12/2011   treated with prevpac  . History of kidney stones   . Hyperlipidemia   . Hypertension   . Sleep apnea     Past Surgical History:  Procedure Laterality Date  . cyst left wrist    . left foot  2004    Prior to Admission medications   Medication Sig Start Date End Date Taking? Authorizing Provider  atorvastatin (LIPITOR) 40 MG tablet Take 40 mg by mouth daily.   Yes [provider]  empagliflozin (JARDIANCE) 25 MG TABS tablet Take 25 mg by mouth daily. 05/03/18  Yes Dixon, Mary B, PA-C  liraglutide (VICTOZA) 18 MG/3ML SOPN INJECT 1.2 MG SUBCUTANEOUSLY ONCE DAILY. Patient taking differently: Inject 1.2 mg into the skin daily.  01/04/18  Yes Stanley, Modena Nunnery, MD  lisinopril (PRINIVIL,ZESTRIL) 10 MG tablet Take 1 tablet (10 mg total) by mouth daily. 01/04/18  Yes Paoli, Modena Nunnery, MD  metFORMIN (GLUCOPHAGE) 1000 MG tablet Take 1 tablet (1,000 mg total) by mouth 2 (two) times daily with a meal. 01/04/18  Yes Energy, Modena Nunnery, MD  Na Sulfate-K Sulfate-Mg Sulf (SUPREP BOWEL PREP KIT) 17.5-3.13-1.6 GM/177ML SOLN Take 1 kit by mouth as directed. 05/24/18  Yes Carlis Stable, NP  pioglitazone (ACTOS) 45 MG tablet Take 1 tablet (45 mg total) by mouth every morning. 05/03/18  Yes Orlena Sheldon, PA-C  tamsulosin (FLOMAX) 0.4 MG CAPS capsule Take 0.4 mg by mouth daily.   Yes [provider]  aspirin 81 MG tablet Take 81 mg by mouth 3 (three) times a week.     [provider]  Blood Glucose Monitoring Suppl (BLOOD GLUCOSE METER KIT AND  SUPPLIES) KIT Dispense monitor (#1), lancets (#100), test strips (#100), and alcohol pads (#100) based on patient and insurance preference. Monitor FSBS 2x daily. DX: ICD-9 250.00 Patient not taking: Reported on 08/02/2018 02/07/14   Alycia Rossetti, MD  glucose blood test strip Use as instructed to monitor FSBS 1x daily. Dx: 250.00 Patient not taking: Reported on 08/02/2018 05/09/14   Alycia Rossetti, MD  Insulin Pen Needle (B-D ULTRAFINE III SHORT PEN) 31G X 8 MM MISC USE AS DIRECTED Patient not taking: Reported on 08/02/2018 08/02/18   Alycia Rossetti, MD    Allergies as of 05/24/2018  . (No Known Allergies)    Family History  Problem Relation Age of Onset  . Breast cancer Mother   . Hypertension Father   . Colon polyps Brother   . Colon cancer Brother   . Colon cancer Paternal Uncle     Social History   Socioeconomic History  . Marital status: Married    Spouse name: Not on file  . Number of children: 3  . Years of education: Not on file  . Highest education level: Not on file  Occupational History  . Occupation: truck Animator Needs  . Financial resource strain: Not on file  . Food insecurity:    Worry: Not on file    Inability: Not on file  . Transportation needs:  Medical: Not on file    Non-medical: Not on file  Tobacco Use  . Smoking status: Former Smoker    Packs/day: 1.00    Years: 18.00    Pack years: 18.00    Types: Cigarettes    Last attempt to quit: 12/19/1995    Years since quitting: 22.6  . Smokeless tobacco: Never Used  Substance and Sexual Activity  . Alcohol use: No  . Drug use: No  . Sexual activity: Yes  Lifestyle  . Physical activity:    Days per week: Not on file    Minutes per session: Not on file  . Stress: Not on file  Relationships  . Social connections:    Talks on phone: Not on file    Gets together: Not on file    Attends religious service: Not on file    Active member of club or organization: Not on file    Attends  meetings of clubs or organizations: Not on file    Relationship status: Not on file  . Intimate partner violence:    Fear of current or ex partner: Not on file    Emotionally abused: Not on file    Physically abused: Not on file    Forced sexual activity: Not on file  Other Topics Concern  . Not on file  Social History Narrative  . Not on file    Review of Systems: See HPI, otherwise negative ROS  Physical Exam: BP 130/74   Pulse 84   Temp 98.2 F (36.8 C) (Oral)   Resp 18   Ht '5\' 7"'  (1.702 m)   Wt 104.3 kg   SpO2 96%   BMI 36.02 kg/m  General:   Alert,  Well-developed, well-nourished, pleasant and cooperative in NAD Neck:  Supple; no masses or thyromegaly. No significant cervical adenopathy. Lungs:  Clear throughout to auscultation.   No wheezes, crackles, or rhonchi. No acute distress. Heart:  Regular rate and rhythm; no murmurs, clicks, rubs,  or gallops. Abdomen: Non-distended, normal bowel sounds.  Soft and nontender without appreciable mass or hepatosplenomegaly.  Pulses:  Normal pulses noted. Extremities:  Without clubbing or edema.  Impression/Plan: 57 year old gentleman with a history of colonic adenoma.  Here for surveillance examination.  The risks, benefits, limitations, alternatives and imponderables have been reviewed with the patient. Questions have been answered. All parties are agreeable.      Notice: This dictation was prepared with Dragon dictation along with smaller phrase technology. Any transcriptional errors that result from this process are unintentional and may not be corrected upon review.

## 2018-08-06 NOTE — Op Note (Signed)
[facility] Patient Name: Scott Perez Procedure Date: 08/06/2018 8:12 AM MRN: [id number] Date of Birth: [birth year] Attending MD: Norvel Richards , MD CSN: [id number] Age: 57 Admit Type: Outpatient Procedure:                Colonoscopy Indications:              High risk colon cancer surveillance: Personal                            history of colonic polyps Providers:                Norvel Richards, MD, Janeece Riggers, RN, Randa Spike, Technician Referring MD:              Medicines:                Midazolam 5 mg IV, Meperidine 25 mg IV Complications:            No immediate complications. Estimated Blood Loss:     Estimated blood loss: none. Procedure:                Pre-Anesthesia Assessment:                           - Prior to the procedure, a History and Physical                            was performed, and patient medications and                            allergies were reviewed. The patient's tolerance of                            previous anesthesia was also reviewed. The risks                            and benefits of the procedure and the sedation                            options and risks were discussed with the patient.                            All questions were answered, and informed consent                            was obtained. Prior Anticoagulants: The patient has                            taken no previous anticoagulant or antiplatelet                            agents. ASA Grade Assessment: II - A patient with  mild systemic disease. After reviewing the risks                            and benefits, the patient was deemed in                            satisfactory condition to undergo the procedure.                           After obtaining informed consent, the colonoscope                            was passed under direct vision. Throughout the                            procedure,  the patient's blood pressure, pulse, and                            oxygen saturations were monitored continuously. The                            CF-[id number] ([id number]) scope was introduced through                            the anus and advanced to the the cecum, identified                            by appendiceal orifice and ileocecal valve. The                            colonoscopy was performed without difficulty. The                            patient tolerated the procedure well. The quality                            of the bowel preparation was adequate. The entire                            colon was well visualized. Scope In: 8:34:41 AM Scope Out: 8:54:51 AM Scope Withdrawal Time: 0 hours 9 minutes 20 seconds  Total Procedure Duration: 0 hours 20 minutes 10 seconds  Findings:      The perianal and digital rectal examinations were normal.      The colon (entire examined portion) appeared normal . 1 cm submucosal       nodule - cecum + pillow sign.      The retroflexed view of the distal rectum and anal verge was normal and       showed no anal or rectal abnormalities. Impression:               - The entire examined colon is normal (colonic                            lipoma and redundancy).                           -  The distal rectum and anal verge are normal on                            retroflexion view.                           - No specimens collected. Moderate Sedation:      Moderate (conscious) sedation was administered by the endoscopy nurse       and supervised by the endoscopist. The following parameters were       monitored: oxygen saturation, heart rate, blood pressure, respiratory       rate, EKG, adequacy of pulmonary ventilation, and response to care.       Total physician intraservice time was 25 minutes. Recommendation:           - Patient has a contact number available for                            emergencies. The signs and symptoms of potential                             delayed complications were discussed with the                            patient. Return to normal activities tomorrow.                            Written discharge instructions were provided to the                            patient.                           - Advance diet as tolerated.                           - Continue present medications.                           - Repeat colonoscopy in 5 years for surveillance.                           - Return to GI office PRN. Procedure Code(s):        --- Professional ---                           [phone], Colonoscopy, flexible; diagnostic, including                            collection of specimen(s) by brushing or washing,                            when performed (separate procedure)                           99153, Moderate sedation; each additional 15  minutes intraservice time                           G0500, Moderate sedation services provided by the                            same physician or other qualified health care                            professional performing a gastrointestinal                            endoscopic service that sedation supports,                            requiring the presence of an independent trained                            observer to assist in the monitoring of the                            patient's level of consciousness and physiological                            status; initial 15 minutes of intra-service time;                            patient age 56 years or older (additional time may                            be reported with [phone], as appropriate) Diagnosis Code(s):        --- Professional ---                           Z86.010, Personal history of colonic polyps CPT copyright 2018 American Medical Association. All rights reserved. The codes documented in this report are preliminary and upon coder review may  be revised to meet current  compliance requirements. [name]. [name], MD Norvel Richards, MD 08/06/2018 9:06:23 AM This report has been signed electronically. Number of Addenda: 0

## 2018-08-06 NOTE — Discharge Instructions (Signed)
°  Colonoscopy Discharge Instructions  Read the instructions outlined below and refer to this sheet in the next few weeks. These discharge instructions provide you with general information on caring for yourself after you leave the hospital. Your doctor may also give you specific instructions. While your treatment has been planned according to the most current medical practices available, unavoidable complications occasionally occur. If you have any problems or questions after discharge, call Dr. Gala Romney at [phone]. ACTIVITY  You may resume your regular activity, but move at a slower pace for the next 24 hours.   Take frequent rest periods for the next 24 hours.   Walking will help get rid of the air and reduce the bloated feeling in your belly (abdomen).   No driving for 24 hours (because of the medicine (anesthesia) used during the test).    Do not sign any important legal documents or operate any machinery for 24 hours (because of the anesthesia used during the test).  NUTRITION  Drink plenty of fluids.   You may resume your normal diet as instructed by your doctor.   Begin with a light meal and progress to your normal diet. Heavy or fried foods are harder to digest and may make you feel sick to your stomach (nauseated).   Avoid alcoholic beverages for 24 hours or as instructed.  MEDICATIONS  You may resume your normal medications unless your doctor tells you otherwise.  WHAT YOU CAN EXPECT TODAY  Some feelings of bloating in the abdomen.   Passage of more gas than usual.   Spotting of blood in your stool or on the toilet paper.  IF YOU HAD POLYPS REMOVED DURING THE COLONOSCOPY:  No aspirin products for 7 days or as instructed.   No alcohol for 7 days or as instructed.   Eat a soft diet for the next 24 hours.  FINDING OUT THE RESULTS OF YOUR TEST Not all test results are available during your visit. If your test results are not back during the visit, make an appointment  with your caregiver to find out the results. Do not assume everything is normal if you have not heard from your caregiver or the medical facility. It is important for you to follow up on all of your test results.  SEEK IMMEDIATE MEDICAL ATTENTION IF:  You have more than a spotting of blood in your stool.   Your belly is swollen (abdominal distention).   You are nauseated or vomiting.   You have a temperature over 101.   You have abdominal pain or discomfort that is severe or gets worse throughout the day.   Your colon appeared normal today.  I recommend you have a repeat colonoscopy in 5 years

## 2018-08-11 ENCOUNTER — Encounter (HOSPITAL_COMMUNITY): Payer: Self-pay | Admitting: Internal Medicine

## 2018-09-03 DIAGNOSIS — G4733 Obstructive sleep apnea (adult) (pediatric): Secondary | ICD-10-CM | POA: Diagnosis not present

## 2018-10-04 DIAGNOSIS — G4733 Obstructive sleep apnea (adult) (pediatric): Secondary | ICD-10-CM | POA: Diagnosis not present

## 2018-10-25 ENCOUNTER — Ambulatory Visit: Payer: Self-pay | Admitting: Nurse Practitioner

## 2018-10-26 ENCOUNTER — Telehealth: Payer: Self-pay | Admitting: Family Medicine

## 2018-10-26 MED ORDER — LIRAGLUTIDE 18 MG/3ML ~~LOC~~ SOPN: PEN_INJECTOR | SUBCUTANEOUS | 3 refills | Status: DC

## 2018-10-26 NOTE — Telephone Encounter (Signed)
Patient calling to get a refill on his victoza  [facility] [place]

## 2018-10-26 NOTE — Telephone Encounter (Signed)
Prescription sent to pharmacy.

## 2018-10-29 ENCOUNTER — Other Ambulatory Visit: Payer: Self-pay | Admitting: Family Medicine

## 2018-11-01 ENCOUNTER — Encounter: Payer: Self-pay | Admitting: *Deleted

## 2018-11-01 NOTE — Telephone Encounter (Signed)
Ok to refill 

## 2018-11-01 NOTE — Telephone Encounter (Signed)
Letter sent.

## 2018-11-01 NOTE — Telephone Encounter (Signed)
PT needs OV for diabetes

## 2018-11-02 DIAGNOSIS — G4733 Obstructive sleep apnea (adult) (pediatric): Secondary | ICD-10-CM | POA: Diagnosis not present

## 2018-11-15 ENCOUNTER — Telehealth: Payer: Self-pay | Admitting: Neurology

## 2018-11-15 NOTE — Telephone Encounter (Signed)
Pt called to r/s 3/26 appt. Please call to advise

## 2018-11-16 NOTE — Telephone Encounter (Signed)
Patient has an appointment with Debbora Presto, NP tomorrow. Patient is aware of appointment and time.

## 2018-11-17 ENCOUNTER — Ambulatory Visit (INDEPENDENT_AMBULATORY_CARE_PROVIDER_SITE_OTHER): Payer: BLUE CROSS/BLUE SHIELD | Admitting: Family Medicine

## 2018-11-17 ENCOUNTER — Encounter: Payer: Self-pay | Admitting: Family Medicine

## 2018-11-17 ENCOUNTER — Other Ambulatory Visit: Payer: Self-pay

## 2018-11-17 DIAGNOSIS — Z9989 Dependence on other enabling machines and devices: Secondary | ICD-10-CM

## 2018-11-17 DIAGNOSIS — G4733 Obstructive sleep apnea (adult) (pediatric): Secondary | ICD-10-CM

## 2018-11-17 NOTE — Progress Notes (Signed)
PATIENT: Scott Perez DOB: April 19, 1961  REASON FOR VISIT: follow up HISTORY FROM: patient  Virtual Visit via Telephone Note  I connected with Scott Perez on 11/17/18 at  1:30 PM EDT by telephone and verified that I am speaking with the correct person using two identifiers.   I discussed the limitations, risks, security and privacy concerns of performing an evaluation and management service by telephone and the availability of in person appointments. I also discussed with the patient that there may be a patient responsible charge related to this service. The patient expressed understanding and agreed to proceed.   History of Present Illness:  11/17/18 Scott Perez is a 58 y.o. male here today for follow up.  He reports that he is doing well with CPAP.  Using his machine every night.  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  He denies any concerns at this time he does wish to consider changing his mask.  He states that he would like to consider using a nasal mask versus a full facemask.  We have reviewed his download report dated 2/20 11/14/2018.  Compliance Report reveals: Compliance [employer]or [employer] [place] Usage 10/16/2018 - 11/14/2018 Usage days 28/30 days (93%) >= 4 hours 25 days (83%) < 4 hours 3 days (10%) Usage hours 152 hours 21 minutes Average usage (total days) 5 hours 5 minutes Average usage (days used) 5 hours 26 minutes Median usage (days used) 5 hours 5 minutes Total used hours (value since last reset - 11/14/2018) 738 hours AirSense 10 AutoSet Serial number [id number] Mode AutoSet Min Pressure 6 cmH2O Max Pressure 12 cmH2O EPR Fulltime EPR level 2 Response Standard Therapy Pressure - cmH2O Median: 8.2 95th percentile: 10.7 Maximum: 11.3 Leaks - L/min Median: 4.4 95th percentile: 13.0 Maximum: 24.0 Events per hour AI: 0.1 HI: 0.3 AHI: 0.4 Apnea Index Central: 0.0 Obstructive: 0.1 Unknown: 0.0 RERA Index 0.4  History  of Present Illness (copied from Dr Edwena Felty note on 06/28/2018) HPI:  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 Buelah Manis for an evaluation of possible sleep apnea.  Mr. Bouknight wasalready scheduled for a colonoscopy 06-24-2018, but missed his appointment, and there is concern about OSA making him a high risk patient.   Mr. Omalley reports that he had a colonoscopy at age 18 and there was nothing abnormal found to his knowledge.  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  His diabetes was diagnosed 2-3 years ago, at the same time as his last colonoscopy.  He also has a history of high cholesterol.  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   He also wakes up with headaches, his sleep is fragmented. He feels  numb in the fingertips and in his feet, feels sometimes weak. He snores loudly and has dry mouth,  Nocturia times 6 -10 !!,  skin itching and feels overall sleepier than he should..  Chief complaint according to patient : " I am more sleepy than usual".   Sleep habits are as follows: he rarely sleeps at home- [place], for [place]. When he returns home,  he can sleep at any time, sleeps between 11 and 6 AM, and naps in mid day. He will have no trouble to sleep after naps at home.  Very frequent bathroom breaks, snoring, reports a 4-5 minute spell of headaches,  throbbing and dull in the morning. He has felt refreshed enough to start the day. He dreams not a lot, not vivid.   Sleep medical history and family sleep history: no family member with OSA, he has 4 sister , 5 brother - one has prostate cancer, all sisters have DM. Mother had DM.  No tonsillectomy history, TBI, No whiplash, ENT procedures.    Social history:  He stays on the road 2-3 weeks at a time, coming home 2-3 nights. After 11 hours he has to break for 10 hours. He feels he sleeps well I his truck.  Day and night driving alternate. Irregular work hours and time zone changes.  Quit smoking and drinking ETOH 20 years ago. Caffeine - no coffee, tea, sodas Dr. Malachi Bonds 4 a day. - and a lot of fruit juice and juice cocktails.    Observations/Objective:  Generalized: Well developed, in no acute distress  Mentation: Alert oriented to time, place, history taking. Follows all commands speech and language fluent   Assessment and Plan:  58 y.o. year old male  has a past medical history of BPH (benign prostatic hypertrophy), Diabetes mellitus, H. pylori infection (12/2011), History of kidney stones, Hyperlipidemia, Hypertension, and Sleep apnea. with    ICD-10-CM   1. OSA on CPAP G47.33 For home use only DME continuous positive airway pressure (CPAP)   Z99.89    Scott Perez is doing well on CPAP therapy at home with optimal compliance.  I have educated him on the need for daily use of CPAP and for greater than 4 hours every night.  We will send an order to his DME company for mask refitting as well as supplies per patient request.  Patient was advised to follow-up in the office in 1 year, sooner if needed.   Orders Placed This Encounter  Procedures  . For home use only DME continuous positive airway pressure (CPAP)    Needs a mask refitting and supplies please    Order Specific Question:   Patient has OSA or probable OSA    Answer:   Yes    Order Specific Question:   Settings    Answer:   Other see comments    Order Specific Question:   CPAP supplies needed    Answer:   Mask, headgear, cushions, filters, heated tubing and water chamber    No orders of the defined types were placed in this encounter.    Follow Up Instructions:  I discussed the assessment and treatment plan with the patient. The patient was provided an  opportunity to ask questions and all were answered. The patient agreed with the plan and demonstrated an understanding of the instructions.   The patient was advised to call back or seek an in-person evaluation if the symptoms worsen or if the condition fails to improve as anticipated.  I provided 25 minutes of non-face-to-face time during this encounter. Call patient is traveling for work and because from his mobile phone, provider at place of residence.  Maryelizabeth Kaufmann, CMA facilitated by visit.   Debbora Presto, NP

## 2018-11-17 NOTE — Patient Instructions (Signed)

## 2018-11-18 ENCOUNTER — Ambulatory Visit: Payer: Self-pay | Admitting: Nurse Practitioner

## 2018-12-03 DIAGNOSIS — G4733 Obstructive sleep apnea (adult) (pediatric): Secondary | ICD-10-CM | POA: Diagnosis not present

## 2019-01-02 DIAGNOSIS — G4733 Obstructive sleep apnea (adult) (pediatric): Secondary | ICD-10-CM | POA: Diagnosis not present

## 2019-02-02 DIAGNOSIS — G4733 Obstructive sleep apnea (adult) (pediatric): Secondary | ICD-10-CM | POA: Diagnosis not present

## 2019-02-13 ENCOUNTER — Other Ambulatory Visit: Payer: Self-pay | Admitting: Family Medicine

## 2019-03-04 DIAGNOSIS — G4733 Obstructive sleep apnea (adult) (pediatric): Secondary | ICD-10-CM | POA: Diagnosis not present

## 2019-03-10 ENCOUNTER — Other Ambulatory Visit: Payer: Self-pay

## 2019-03-11 ENCOUNTER — Ambulatory Visit: Payer: BLUE CROSS/BLUE SHIELD | Admitting: Family Medicine

## 2019-03-11 ENCOUNTER — Encounter: Payer: Self-pay | Admitting: Family Medicine

## 2019-03-11 VITALS — BP 110/62 | HR 94 | Temp 98.2°F | Resp 16 | Ht 67.0 in | Wt 230.0 lb

## 2019-03-11 DIAGNOSIS — E1165 Type 2 diabetes mellitus with hyperglycemia: Secondary | ICD-10-CM | POA: Diagnosis not present

## 2019-03-11 DIAGNOSIS — I10 Essential (primary) hypertension: Secondary | ICD-10-CM

## 2019-03-11 DIAGNOSIS — R972 Elevated prostate specific antigen [PSA]: Secondary | ICD-10-CM

## 2019-03-11 DIAGNOSIS — Z23 Encounter for immunization: Secondary | ICD-10-CM

## 2019-03-11 DIAGNOSIS — Z6836 Body mass index (BMI) 36.0-36.9, adult: Secondary | ICD-10-CM

## 2019-03-11 DIAGNOSIS — E785 Hyperlipidemia, unspecified: Secondary | ICD-10-CM | POA: Diagnosis not present

## 2019-03-11 NOTE — Progress Notes (Signed)
   Subjective:    [name], male    DOB: [birth year], 58 y.o.   MRN: [id number]  Patient presents for Diabetes  Patient here to follow-up chronic medical problems.  His last visit was in August 2019.  He has diabetes mellitus which is been uncontrolled.  His last A1c in September was 9% it had improved from greater than 14.  He also had elevated PSA which he was referred to urology for.  At that visit his Vania Rea was increased to 25 mg once a day Actos 45 mg was also added.  He was maintained on Victoza.    cbg thsi morning  220,  did not meter  no hypoglycemia symptoms        Home test A1C was 9% 3 months    For hyperlipidemia his Lipitor was increased from 40 mg to 80 mg.   BPH still on flomax   TDAP due  Review Of Systems:  GEN- denies fatigue, fever, weight loss,weakness, recent illness HEENT- denies eye drainage, change in vision, nasal discharge, CVS- denies chest pain, palpitations RESP- denies SOB, cough, wheeze ABD- denies N/V, change in stools, abd pain GU- denies dysuria, hematuria, dribbling, incontinence MSK- denies joint pain, muscle aches, injury Neuro- denies headache, dizziness, syncope, seizure activity       Objective:    BP 110/62   Pulse 94   Temp 98.2 F (36.8 C) (Oral)   Resp 16   Ht 5\' 7"  (1.702 m)   Wt 230 lb (104.3 kg)   SpO2 98%   BMI 36.02 kg/m  GEN- NAD, alert and oriented x3 HEENT- PERRL, EOMI, non injected sclera, pink conjunctiva, MMM, oropharynx clear Neck- Supple, no thyromegaly CVS- RRR, no murmur RESP-CTAB ABD-NABS,soft,NT,ND EXT- No edema Pulses- Radial, DP- 2+        Assessment & Plan:      Problem List Items Addressed This Visit      Unprioritized   Diabetes type 2, uncontrolled ([place])    Chronically uncontrolled, discussed regular follow up and diet  He drives for living He is on max medications non insulin If he needs insulin would not recommend him driving       Relevant Orders   Hemoglobin A1c (Completed)   Lipid panel (Completed)   Microalbumin / creatinine urine ratio (Completed)   HM DIABETES FOOT EXAM (Completed)   Elevated PSA    Due for f/u urology and PSA will obtain       Relevant Orders   PSA (Completed)   Essential hypertension, benign - Primary    Controlled no changes meds      Relevant Orders   CBC with Differential/Platelet (Completed)   Comprehensive metabolic panel (Completed)   Lipid panel (Completed)   Hyperlipidemia   Obesity    Other Visit Diagnoses    Need for tetanus, diphtheria, and acellular pertussis (Tdap) vaccine       Relevant Orders   Tdap vaccine greater than or equal to 7yo IM (Completed)      Note: This dictation was prepared with Dragon dictation along with smaller phrase technology. Any transcriptional errors that result from this process are unintentional.

## 2019-03-11 NOTE — Patient Instructions (Addendum)
F/U 4 months for Physical  

## 2019-03-12 LAB — HEMOGLOBIN A1C
Hgb A1c MFr Bld: 12.5 % of total Hgb — ABNORMAL HIGH (ref <5.7)
Mean Plasma Glucose: 312 (calc)
eAG (mmol/L): 17.3 (calc)

## 2019-03-12 LAB — CBC WITH DIFFERENTIAL/PLATELET
Absolute Monocytes: 451 cells/uL (ref 200–950)
Basophils Absolute: 58 cells/uL (ref 0–200)
Basophils Relative: 1.1 %
Eosinophils Absolute: 170 cells/uL (ref 15–500)
Eosinophils Relative: 3.2 %
HCT: 50.8 % — ABNORMAL HIGH (ref 38.5–50.0)
Hemoglobin: 16.5 g/dL (ref 13.2–17.1)
Lymphs Abs: 1802 cells/uL (ref 850–3900)
MCH: 28.6 pg (ref 27.0–33.0)
MCHC: 32.5 g/dL (ref 32.0–36.0)
MCV: 88 fL (ref 80.0–100.0)
MPV: 11.3 fL (ref 7.5–12.5)
Monocytes Relative: 8.5 %
Neutro Abs: 2820 cells/uL (ref 1500–7800)
Neutrophils Relative %: 53.2 %
Platelets: 208 10*3/uL (ref 140–400)
RBC: 5.77 10*6/uL (ref 4.20–5.80)
RDW: 12.7 % (ref 11.0–15.0)
Total Lymphocyte: 34 %
WBC: 5.3 10*3/uL (ref 3.8–10.8)

## 2019-03-12 LAB — COMPREHENSIVE METABOLIC PANEL
AG Ratio: 1.3 (calc) (ref 1.0–2.5)
ALT: 21 U/L (ref 9–46)
AST: 17 U/L (ref 10–35)
Albumin: 4 g/dL (ref 3.6–5.1)
Alkaline phosphatase (APISO): 79 U/L (ref 35–144)
BUN/Creatinine Ratio: 13 (calc) (ref 6–22)
BUN: 19 mg/dL (ref 7–25)
CO2: 24 mmol/L (ref 20–32)
Calcium: 11.5 mg/dL — ABNORMAL HIGH (ref 8.6–10.3)
Chloride: 101 mmol/L (ref 98–110)
Creat: 1.48 mg/dL — ABNORMAL HIGH (ref 0.70–1.33)
Globulin: 3.1 g/dL (calc) (ref 1.9–3.7)
Glucose, Bld: 247 mg/dL — ABNORMAL HIGH (ref 65–99)
Potassium: 4.7 mmol/L (ref 3.5–5.3)
Sodium: 135 mmol/L (ref 135–146)
Total Bilirubin: 0.5 mg/dL (ref 0.2–1.2)
Total Protein: 7.1 g/dL (ref 6.1–8.1)

## 2019-03-12 LAB — LIPID PANEL
Cholesterol: 242 mg/dL — ABNORMAL HIGH (ref <200)
HDL: 41 mg/dL (ref >=40)
LDL Cholesterol (Calc): 171 mg/dL (calc) — ABNORMAL HIGH
Non-HDL Cholesterol (Calc): 201 mg/dL (calc) — ABNORMAL HIGH (ref <130)
Total CHOL/HDL Ratio: 5.9 (calc) — ABNORMAL HIGH (ref <5.0)
Triglycerides: 158 mg/dL — ABNORMAL HIGH (ref <150)

## 2019-03-12 LAB — MICROALBUMIN / CREATININE URINE RATIO
Creatinine, Urine: 85 mg/dL (ref 20–320)
Microalb Creat Ratio: 22 mcg/mg creat (ref <30)
Microalb, Ur: 1.9 mg/dL

## 2019-03-12 LAB — PSA: PSA: 4.9 ng/mL — ABNORMAL HIGH (ref <=4.0)

## 2019-03-13 ENCOUNTER — Encounter: Payer: Self-pay | Admitting: Family Medicine

## 2019-03-13 NOTE — Assessment & Plan Note (Signed)
Controlled no changes meds

## 2019-03-13 NOTE — Assessment & Plan Note (Signed)
Due for f/u urology and PSA will obtain

## 2019-03-13 NOTE — Assessment & Plan Note (Signed)
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

## 2019-03-18 ENCOUNTER — Other Ambulatory Visit: Payer: Self-pay | Admitting: *Deleted

## 2019-03-18 DIAGNOSIS — R972 Elevated prostate specific antigen [PSA]: Secondary | ICD-10-CM

## 2019-03-18 MED ORDER — VICTOZA 18 MG/3ML ~~LOC~~ SOPN: PEN_INJECTOR | SUBCUTANEOUS | 3 refills | Status: DC

## 2019-04-04 DIAGNOSIS — G4733 Obstructive sleep apnea (adult) (pediatric): Secondary | ICD-10-CM | POA: Diagnosis not present

## 2019-04-15 ENCOUNTER — Other Ambulatory Visit: Payer: Self-pay

## 2019-04-15 ENCOUNTER — Other Ambulatory Visit: Payer: Self-pay | Admitting: Family Medicine

## 2019-04-18 ENCOUNTER — Ambulatory Visit: Payer: Self-pay | Admitting: Family Medicine

## 2019-04-19 ENCOUNTER — Encounter: Payer: Self-pay | Admitting: Family Medicine

## 2019-04-19 ENCOUNTER — Other Ambulatory Visit: Payer: Self-pay | Admitting: *Deleted

## 2019-04-19 ENCOUNTER — Ambulatory Visit: Payer: BLUE CROSS/BLUE SHIELD | Admitting: Family Medicine

## 2019-04-19 ENCOUNTER — Telehealth: Payer: Self-pay | Admitting: Family Medicine

## 2019-04-19 ENCOUNTER — Other Ambulatory Visit: Payer: Self-pay

## 2019-04-19 DIAGNOSIS — Z23 Encounter for immunization: Secondary | ICD-10-CM

## 2019-04-19 DIAGNOSIS — E1165 Type 2 diabetes mellitus with hyperglycemia: Secondary | ICD-10-CM

## 2019-04-19 DIAGNOSIS — E785 Hyperlipidemia, unspecified: Secondary | ICD-10-CM

## 2019-04-19 MED ORDER — ATORVASTATIN CALCIUM 40 MG PO TABS: 40.0000 mg | ORAL_TABLET | Freq: Every day | ORAL | 3 refills | Status: DC

## 2019-04-19 MED ORDER — LISINOPRIL 10 MG PO TABS: 10.0000 mg | ORAL_TABLET | Freq: Every day | ORAL | 3 refills | Status: DC

## 2019-04-19 MED ORDER — JARDIANCE 25 MG PO TABS: 25.0000 mg | ORAL_TABLET | Freq: Every day | ORAL | 3 refills | Status: DC

## 2019-04-19 NOTE — Patient Instructions (Signed)
F/U as previous 

## 2019-04-19 NOTE — Assessment & Plan Note (Signed)
His home readings are much improved now that he is more consistent with this medication.  We discussed diet as well and eat more protein and [employer].  He can drink Glucerna if he is not hungry first thing the morning after he takes his medications.  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  He has been a follow-up in October we will recheck his A1c at that time.

## 2019-04-19 NOTE — Assessment & Plan Note (Signed)
Continue statin drug.  Recheck lipids next visit

## 2019-04-19 NOTE — Progress Notes (Signed)
   Subjective:    [name], male    DOB: 1961-04-27, 58 y.o.   MRN: [id number]  Patient presents for Follow-up (is fasting)  Patient here for interim check on diabetes mellitus.  His A1c resulted at 12.5%.  He admits today he had not been taking his medication regularly he was also eating a lot of fruit on the road and too many crackers.  He drives for living.  He is currently taking metformin, Jardiance, Actos regularly and I also increase his Victoza to 1.8 mg.  His weight is down 3 pounds.  His blood sugars fasting are on average 90-1 40 after meal 1 50-2 26.  He does admit that he will often take his medicines now around 9:00 cannot eat for couple hours later but states that he is not feel jittery.  He denies any hypoglycemia symptoms.  He also had significant hyperlipidemia he is now back on his Lipitor.  He also had mildly elevated PSA however this was increased from his last check 6 months ago the urology referral has been placed he is awaiting a phone call for this.       Review Of Systems:  GEN- denies fatigue, fever, weight loss,weakness, recent illness HEENT- denies eye drainage, change in vision, nasal discharge, CVS- denies chest pain, palpitations RESP- denies SOB, cough, wheeze ABD- denies N/V, change in stools, abd pain GU- denies dysuria, hematuria, dribbling, incontinence MSK- denies joint pain, muscle aches, injury Neuro- denies headache, dizziness, syncope, seizure activity       Objective:    BP 118/70   Pulse 80   Temp 98.4 F (36.9 C) (Oral)   Resp 14   Ht 5\' 7"  (1.702 m)   Wt 227 lb (103 kg)   SpO2 98%   BMI 35.55 kg/m  GEN- NAD, alert and oriented x3 HEENT- PERRL, EOMI, non injected sclera, pink conjunctiva, MMM, oropharynx clear CVS- RRR, no murmur RESP-CTAB        Assessment & Plan:      Problem List Items Addressed This Visit      Unprioritized   Diabetes type 2, uncontrolled ([place])    His home readings are much  improved now that he is more consistent with this medication.  We discussed diet as well and eat more protein and [employer].  He can drink Glucerna if he is not hungry first thing the morning after he takes his medications.  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  He has been a follow-up in October we will recheck his A1c at that time.      Hyperlipidemia    Continue statin drug.  Recheck lipids next visit       Other Visit Diagnoses    Need for immunization against influenza       Relevant Orders   Flu Vaccine QUAD 36+ mos IM (Completed)      Note: This dictation was prepared with Dragon dictation along with smaller phrase technology. Any transcriptional errors that result from this process are unintentional.

## 2019-04-19 NOTE — Telephone Encounter (Signed)
Error

## 2019-04-27 ENCOUNTER — Other Ambulatory Visit: Payer: Self-pay | Admitting: Family Medicine

## 2019-04-27 MED ORDER — PIOGLITAZONE HCL 45 MG PO TABS: 45.0000 mg | ORAL_TABLET | ORAL | 5 refills | Status: DC

## 2019-05-05 DIAGNOSIS — G4733 Obstructive sleep apnea (adult) (pediatric): Secondary | ICD-10-CM | POA: Diagnosis not present

## 2019-05-13 ENCOUNTER — Ambulatory Visit (INDEPENDENT_AMBULATORY_CARE_PROVIDER_SITE_OTHER): Payer: BLUE CROSS/BLUE SHIELD | Admitting: Urology

## 2019-05-13 ENCOUNTER — Other Ambulatory Visit: Payer: Self-pay

## 2019-05-13 DIAGNOSIS — R972 Elevated prostate specific antigen [PSA]: Secondary | ICD-10-CM

## 2019-05-13 DIAGNOSIS — N5201 Erectile dysfunction due to arterial insufficiency: Secondary | ICD-10-CM | POA: Diagnosis not present

## 2019-05-18 ENCOUNTER — Other Ambulatory Visit: Payer: Self-pay | Admitting: Urology

## 2019-05-18 ENCOUNTER — Other Ambulatory Visit (HOSPITAL_COMMUNITY): Payer: Self-pay | Admitting: Urology

## 2019-05-18 DIAGNOSIS — R972 Elevated prostate specific antigen [PSA]: Secondary | ICD-10-CM

## 2019-05-30 ENCOUNTER — Other Ambulatory Visit: Payer: Self-pay | Admitting: Family Medicine

## 2019-05-31 ENCOUNTER — Other Ambulatory Visit: Payer: Self-pay

## 2019-05-31 ENCOUNTER — Other Ambulatory Visit: Payer: Self-pay | Admitting: Urology

## 2019-05-31 ENCOUNTER — Encounter (HOSPITAL_COMMUNITY): Payer: Self-pay

## 2019-05-31 ENCOUNTER — Ambulatory Visit (HOSPITAL_COMMUNITY)
Admission: RE | Admit: 2019-05-31 | Discharge: 2019-05-31 | Disposition: A | Discharge status: Home / Self Care | Payer: BLUE CROSS/BLUE SHIELD | Source: Ambulatory Visit | Attending: Urology | Admitting: Urology

## 2019-05-31 DIAGNOSIS — R972 Elevated prostate specific antigen [PSA]: Secondary | ICD-10-CM | POA: Diagnosis not present

## 2019-05-31 MED ORDER — GENTAMICIN SULFATE 40 MG/ML IJ SOLN
160.0000 mg | Freq: Once | INTRAMUSCULAR | Status: AC
  Administered 2019-05-31: 13:00:00 160 mg via INTRAMUSCULAR

## 2019-05-31 MED ORDER — LIDOCAINE HCL (PF) 2 % IJ SOLN
INTRAMUSCULAR | Status: AC
  Administered 2019-05-31: 13:00:00
  Filled 2019-05-31: qty 10

## 2019-05-31 MED ORDER — GENTAMICIN SULFATE 40 MG/ML IJ SOLN
INTRAMUSCULAR | Status: AC
  Administered 2019-05-31: 160 mg via INTRAMUSCULAR
  Filled 2019-05-31: qty 4

## 2019-06-17 ENCOUNTER — Ambulatory Visit: Payer: BLUE CROSS/BLUE SHIELD | Admitting: Urology

## 2019-06-20 ENCOUNTER — Encounter: Payer: BLUE CROSS/BLUE SHIELD | Admitting: Family Medicine

## 2019-07-14 ENCOUNTER — Other Ambulatory Visit: Payer: Self-pay

## 2019-07-15 ENCOUNTER — Ambulatory Visit: Payer: BLUE CROSS/BLUE SHIELD | Admitting: Family Medicine

## 2019-07-15 ENCOUNTER — Encounter: Payer: Self-pay | Admitting: Family Medicine

## 2019-07-15 ENCOUNTER — Ambulatory Visit: Payer: BLUE CROSS/BLUE SHIELD | Admitting: Urology

## 2019-07-15 VITALS — BP 132/70 | HR 84 | Temp 97.9°F | Resp 16 | Ht 67.0 in | Wt 233.0 lb

## 2019-07-15 DIAGNOSIS — E1165 Type 2 diabetes mellitus with hyperglycemia: Secondary | ICD-10-CM

## 2019-07-15 DIAGNOSIS — I10 Essential (primary) hypertension: Secondary | ICD-10-CM | POA: Diagnosis not present

## 2019-07-15 DIAGNOSIS — Z6836 Body mass index (BMI) 36.0-36.9, adult: Secondary | ICD-10-CM

## 2019-07-15 DIAGNOSIS — E785 Hyperlipidemia, unspecified: Secondary | ICD-10-CM | POA: Diagnosis not present

## 2019-07-15 DIAGNOSIS — D1722 Benign lipomatous neoplasm of skin and subcutaneous tissue of left arm: Secondary | ICD-10-CM

## 2019-07-15 NOTE — Patient Instructions (Signed)
F/U 4 months  

## 2019-07-15 NOTE — Progress Notes (Signed)
   Subjective:    [name], male    DOB: 02-06-1961, 58 y.o.   MRN: [id number]  Patient presents for Follow-up (DM)  DM- did not bring his meter with him today  currently on victoza 1.8mg , jardiance, metformin, actos   highest cbg fasting 197   Fasting this AM  157 , no hypoglycemia symptoms   Hyperlipidemia- taking lipitor 40mg  regulary   He had biopsy done of the prostate, he had 1 of 12 biopsy with cancer cells, now on surveillance   f/u [facility]  Dec 8th with Urology   He has noticed he still has a knot since his flu shot on Left post shoulder  If he holds his arm a certain way, such as when he is driving, his will get tingling and pain into the arm. He denies weakness, not dropping items  Review Of Systems:  GEN- denies fatigue, fever, weight loss,weakness, recent illness HEENT- denies eye drainage, change in vision, nasal discharge, CVS- denies chest pain, palpitations RESP- denies SOB, cough, wheeze ABD- denies N/V, change in stools, abd pain GU- denies dysuria, hematuria, dribbling, incontinence MSK- + joint pain, muscle aches, injury Neuro- denies headache, dizziness, syncope, seizure activity       Objective:    BP 132/70   Pulse 84   Temp 97.9 F (36.6 C) (Temporal)   Resp 16   Ht 5\' 7"  (1.702 m)   Wt 233 lb (105.7 kg)   SpO2 97%   BMI 36.49 kg/m  GEN- NAD, alert and oriented x3 HEENT- PERRL, EOMI, non injected sclera, pink conjunctiva, MMM, oropharynx clear Neck- Supple, no thyromegaly, neg spurlings, C spine NT  CVS- RRR, no murmur RESP-CTAB ABD-NABS,soft,NT,ND MSK- Bilat shoulder inspection- left golf ball size soft tissue mass, NT, no erythema, no fluctance, FROM upper ext Neuro- strength in tact UE, sensation grossly in tact  EXT- No edema Pulses- Radial, DP- 2+        Assessment & Plan:      Problem List Items Addressed This Visit      Unprioritized   Diabetes type 2, uncontrolled ([place])    Goal A1C less than 7% No  change to current meds Check labs today Direct LDL to be done, on statin drug lipitor  Also on ASA 81mg       Relevant Orders   Hemoglobin A1c (Completed)   Essential hypertension, benign - Primary    BP looks good,no change to meds Working on dietary changes       Relevant Orders   Basic metabolic panel (Completed)   Hyperlipidemia   Relevant Orders   LDL Cholesterol, Direct (Completed)   Obesity    Other Visit Diagnoses    Lipoma of left upper extremity       discussed removal, will hold for now, if arms symptoms progress will need imaging and intervention      Note: This dictation was prepared with Dragon dictation along with smaller phrase technology. Any transcriptional errors that result from this process are unintentional.

## 2019-07-16 LAB — BASIC METABOLIC PANEL
BUN: 14 mg/dL (ref 7–25)
CO2: 25 mmol/L (ref 20–32)
Calcium: 10.9 mg/dL — ABNORMAL HIGH (ref 8.6–10.3)
Chloride: 104 mmol/L (ref 98–110)
Creat: 1.25 mg/dL (ref 0.70–1.33)
Glucose, Bld: 103 mg/dL — ABNORMAL HIGH (ref 65–99)
Potassium: 4 mmol/L (ref 3.5–5.3)
Sodium: 138 mmol/L (ref 135–146)

## 2019-07-16 LAB — HEMOGLOBIN A1C
Hgb A1c MFr Bld: 7.1 % of total Hgb — ABNORMAL HIGH (ref <5.7)
Mean Plasma Glucose: 157 (calc)
eAG (mmol/L): 8.7 (calc)

## 2019-07-16 LAB — LDL CHOLESTEROL, DIRECT: Direct LDL: 87 mg/dL (ref <100)

## 2019-07-17 ENCOUNTER — Encounter: Payer: Self-pay | Admitting: Family Medicine

## 2019-07-17 NOTE — Assessment & Plan Note (Addendum)
Goal A1C less than 7% No change to current meds Check labs today Direct LDL to be done, on statin drug lipitor  Also on ASA 81mg   He has eye appt scheduled

## 2019-07-17 NOTE — Assessment & Plan Note (Signed)
BP looks good,no change to meds Working on dietary changes

## 2019-07-18 ENCOUNTER — Encounter: Payer: Self-pay | Admitting: *Deleted

## 2019-09-12 NOTE — Progress Notes (Signed)
H&P   History of Present Illness: Here for follow-up prostate cancer.  10.6.2020: TRUS.Bx for elevated PSA. PSA 4.9. Prostate volume 96 ml. PSAD 0.05. 1/12 cores  (lt base lateral) reveales GS 3+3 pattern in 10% of core. He decided on active surveillance.  1.19.2021: This man is here today this working this man presents today for follow-up.  He has BPH, IPSS 17, quality-of-life score 3.  He would like to consider additional medical therapy.  He also complains of worsening erectile dysfunction.  Cialis and Viagra have both been unsuccessful in managing this.  Past Medical History:  Diagnosis Date  . BPH (benign prostatic hypertrophy)   . Diabetes mellitus   . H. pylori infection 12/2011   treated with prevpac  . History of kidney stones   . Hyperlipidemia   . Hypertension   . Sleep apnea     Past Surgical History:  Procedure Laterality Date  . COLONOSCOPY N/A 08/06/2018   Procedure: COLONOSCOPY;  Surgeon: Daneil Dolin, MD;  Location: [facility];  Service: Endoscopy;  Laterality: N/A;  8:30  . cyst left wrist    . left foot  2004    Home Medications:  Allergies as of 09/13/2019   No Known Allergies     Medication List       Accurate as of September 12, 2019  7:24 PM. If you have any questions, ask your nurse or doctor.        aspirin 81 MG tablet Take 81 mg by mouth 3 (three) times a week.   atorvastatin 40 MG tablet Commonly known as: LIPITOR Take 1 tablet (40 mg total) by mouth daily.   Jardiance 25 MG Tabs tablet Generic drug: empagliflozin Take 25 mg by mouth daily.   lisinopril 10 MG tablet Commonly known as: ZESTRIL Take 1 tablet (10 mg total) by mouth daily.   metFORMIN 1000 MG tablet Commonly known as: GLUCOPHAGE TAKE 1 TABLET BY MOUTH TWICE DAILY WITH A MEAL   pioglitazone 45 MG tablet Commonly known as: Actos Take 1 tablet (45 mg total) by mouth every morning.   sildenafil 100 MG tablet Commonly known as: VIAGRA TAKE 1 TABLET BY MOUTH  ONCE DAILY AS NEEDED FOR  ERECTILE  DYSFUNCTION   tamsulosin 0.4 MG Caps capsule Commonly known as: FLOMAX Take 1 capsule by mouth once daily   Victoza 18 MG/3ML Sopn Generic drug: liraglutide INJECT 1.8 MG SUBCUTANEOUSLY ONCE DAILY       Allergies: No Known Allergies  Family History  Problem Relation Age of Onset  . Breast cancer Mother   . Hypertension Father   . Colon polyps Brother   . Colon cancer Brother   . Colon cancer Paternal Uncle     Social History:  reports that he quit smoking about 23 years ago. His smoking use included cigarettes. He has a 18.00 pack-year smoking history. He has never used smokeless tobacco. He reports that he does not drink alcohol or use drugs.  Urological Symptom Review  Patient is experiencing the following symptoms: Get up at night to urinate Stream starts and stops Trouble starting stream Erection problems (male only)   Review of Systems  Gastrointestinal (upper)  : Negative for upper GI symptoms  Gastrointestinal (lower) : Constipation  Constitutional : Negative for symptoms  Skin: Itching  Eyes: Negative for eye symptoms  Ear/Nose/Throat : Negative for Ear/Nose/Throat symptoms  Hematologic/Lymphatic: Negative for Hematologic/Lymphatic symptoms  Cardiovascular : Negative for cardiovascular symptoms  Respiratory : Negative for respiratory symptoms  Endocrine: Negative for endocrine symptoms  Musculoskeletal: Negative for musculoskeletal symptoms  Neurological: Headaches  Psychologic: Negative for psychiatric symptoms  Physical Exam:  Vital signs in last 24 hours: There were no vitals taken for this visit. Constitutional:  Alert and oriented, No acute distress Cardiovascular: Regular rate  Respiratory: Normal respiratory effort Genitourinary: Normal male phallus, testes are descended bilaterally and non-tender and without masses, scrotum is normal in appearance without lesions or masses, perineum  is normal on inspection. Prostate 80 gms/benign Lymphatic: No lymphadenopathy Neurologic: Grossly intact, no focal deficits Psychiatric: Normal mood and affect  Laboratory Data:  No results for input(s): WBC, HGB, HCT, PLT in the last 72 hours.  No results for input(s): NA, K, CL, GLUCOSE, BUN, CALCIUM, CREATININE in the last 72 hours.  Invalid input(s): CO3  I reviewed Bx results w/ pt  No results found for this or any previous visit (from the past 24 hour(s)). No results found for this or any previous visit (from the past 240 hour(s)).  Renal Function: No results for input(s): CREATININE in the last 168 hours. CrCl cannot be calculated (Patient's most recent lab result is older than the maximum 21 days allowed.).  Radiologic Imaging: No results found.  Impression/Assessment:   1.  BPH, symptoms worsening.  2.  Prostate cancer, low-volume/low risk disease, on active surveillance  3.  ED, worsening  Plan:  1.  I prescribed prostaglandin E1, 10 mcg prefilled syringes.  He will bring 1 in in the next month or so to learn injection  2.  Increase tamsulosin to twice a day.  Also discussed possible finasteride introduction  3.  PSA will be checked today.  I will see him back in about 4 months for repeat PSA/DRE, and then consider/schedule eventual MRI with fusion biopsy in about 8 months

## 2019-09-13 ENCOUNTER — Other Ambulatory Visit: Payer: Self-pay | Admitting: Urology

## 2019-09-13 ENCOUNTER — Ambulatory Visit (INDEPENDENT_AMBULATORY_CARE_PROVIDER_SITE_OTHER): Payer: BLUE CROSS/BLUE SHIELD | Admitting: Urology

## 2019-09-13 ENCOUNTER — Encounter: Payer: Self-pay | Admitting: Urology

## 2019-09-13 ENCOUNTER — Other Ambulatory Visit: Payer: Self-pay | Admitting: Family Medicine

## 2019-09-13 ENCOUNTER — Other Ambulatory Visit: Payer: Self-pay

## 2019-09-13 VITALS — BP 138/71 | HR 88 | Temp 96.8°F | Ht 67.0 in | Wt 230.0 lb

## 2019-09-13 DIAGNOSIS — C61 Malignant neoplasm of prostate: Secondary | ICD-10-CM | POA: Diagnosis not present

## 2019-09-13 LAB — PSA: PSA: 3.4 ng/mL (ref <=4.0)

## 2019-09-13 MED ORDER — TAMSULOSIN HCL 0.4 MG PO CAPS: 0.4000 mg | ORAL_CAPSULE | Freq: Every day | ORAL | 0 refills | Status: DC

## 2019-09-13 MED ORDER — SILDENAFIL CITRATE 100 MG PO TABS: 100.0000 mg | ORAL_TABLET | ORAL | 0 refills | Status: DC | PRN

## 2019-09-13 NOTE — Progress Notes (Deleted)
Urological Symptom Review  Patient is experiencing the following symptoms: Get up at night to urinate Stream starts and stops Trouble starting stream Erection problems (male only)   Review of Systems  Gastrointestinal (upper)  : Negative for upper GI symptoms  Gastrointestinal (lower) : Constipation  Constitutional : Negative for symptoms  Skin: Itching  Eyes: Negative for eye symptoms  Ear/Nose/Throat : Negative for Ear/Nose/Throat symptoms  Hematologic/Lymphatic: Negative for Hematologic/Lymphatic symptoms  Cardiovascular : Negative for cardiovascular symptoms  Respiratory : Negative for respiratory symptoms  Endocrine: Negative for endocrine symptoms  Musculoskeletal: Negative for musculoskeletal symptoms  Neurological: Headaches  Psychologic: Negative for psychiatric symptoms

## 2019-09-13 NOTE — Telephone Encounter (Signed)
Ok to refill Sildenafil?

## 2019-09-14 ENCOUNTER — Other Ambulatory Visit: Payer: Self-pay | Admitting: Urology

## 2019-09-26 ENCOUNTER — Telehealth: Payer: Self-pay | Admitting: *Deleted

## 2019-09-26 NOTE — Telephone Encounter (Signed)
Pt called need a release note from Dr Brett Fairy stating he is release from under her care. Please call [phone]

## 2019-09-27 ENCOUNTER — Telehealth: Payer: Self-pay

## 2019-09-27 NOTE — Telephone Encounter (Signed)
-----   Message from Franchot Gallo, MD sent at 09/27/2019 12:55 PM EST ----- Regarding: psa Notify pt psa 3.4 lower ----- Message ----- From: Dorisann Frames, RN Sent: 09/14/2019  11:05 AM EST To: Franchot Gallo, MD  I cant remember, I may have already sent this to you.  ----- Message ----- From: Interface, [facility] Results In Sent: 09/14/2019  10:00 AM EST To: [facility] [place]

## 2019-09-27 NOTE — Telephone Encounter (Signed)
Called the patient and advise the patient that we last saw him in march for a initial CPAP follow up. I advised that at that time he was using the machine and was compliant and she advised that the patient follow up in a year. Advised the patient that when I look at download I see he has not used his machine in over a year. Patient states that he stopped using it because [employer] would never give him supplies. I advised that I am unsure on what happened on [employer]'s side but from what I can tell he stopped using it right after he had his initial apt. Informed him if that is the case then insurance will not cover his supplies. I advised would be happy to check with [employer] on what happened on their end and message him with what they state, but from what I can tell it is due to noncompliance. Pt never reached out to ask [place] to reach out to [employer] and he states that [employer] would never contact him. 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 I have scheduled the patient on April 5th at 8:30 am. I advised starting tonight he needs to clean all his supplies he currently has and use the machine until he meets compliance. Advised I would have [employer] reach out to discuss their side.

## 2019-09-27 NOTE — Telephone Encounter (Signed)
-----   Message from Franchot Gallo, MD sent at 09/27/2019 12:55 PM EST ----- Regarding: psa Notify pt psa 3.4 lower ----- Message ----- From: Dorisann Frames, RN Sent: 09/14/2019  11:05 AM EST To: Franchot Gallo, MD  I cant remember, I may have already sent this to you.  ----- Message ----- From: Interface, [facility] Results In Sent: 09/14/2019  10:00 AM EST To: [facility] [place]

## 2019-09-27 NOTE — Telephone Encounter (Signed)
Pt notified of results

## 2019-09-27 NOTE — Telephone Encounter (Signed)
Called pt.  No answer.  Left message

## 2019-10-03 ENCOUNTER — Other Ambulatory Visit: Payer: Self-pay | Admitting: Family Medicine

## 2019-10-11 ENCOUNTER — Ambulatory Visit: Payer: BLUE CROSS/BLUE SHIELD | Admitting: Urology

## 2019-11-14 ENCOUNTER — Telehealth: Payer: Self-pay | Admitting: Family Medicine

## 2019-11-14 NOTE — Telephone Encounter (Signed)
I LMVM for pt that his message speaks for medications for diabetes.  Was not sure if he ment to send to [place]?  I so call us back.

## 2019-11-14 NOTE — Telephone Encounter (Signed)
Patient called stating he is not on insulin. States he is on victoza and needs that updated for his DLT provider states they will be faxing over a form for completion.

## 2019-11-17 ENCOUNTER — Ambulatory Visit: Payer: BLUE CROSS/BLUE SHIELD | Admitting: Family Medicine

## 2019-11-18 ENCOUNTER — Ambulatory Visit: Payer: BLUE CROSS/BLUE SHIELD | Admitting: Family Medicine

## 2019-11-25 ENCOUNTER — Ambulatory Visit: Payer: BC Managed Care – PPO | Attending: Internal Medicine

## 2019-11-25 DIAGNOSIS — Z23 Encounter for immunization: Secondary | ICD-10-CM

## 2019-11-25 NOTE — Progress Notes (Signed)
   Covid-19 Vaccination Clinic  Name:  Scott Perez    MRN: [id number] DOB: 08/26/1960  11/25/2019  [name] was observed post Covid-19 immunization for 15 minutes without incident. He was provided with Vaccine Information Sheet and instruction to access the V-Safe system.   [name] was instructed to call 911 with any severe reactions post vaccine: Marland Kitchen Difficulty breathing  . Swelling of face and throat  . A fast heartbeat  . A bad rash all over body  . Dizziness and weakness   Immunizations Administered    Name Date Dose VIS Date Route   Moderna COVID-19 Vaccine 11/25/2019  8:54 AM 0.5 mL 07/26/2019 Intramuscular   Manufacturer: Moderna   Lot: [id number]   [id number]

## 2019-11-28 ENCOUNTER — Encounter: Payer: Self-pay | Admitting: Adult Health

## 2019-11-28 ENCOUNTER — Ambulatory Visit: Payer: BLUE CROSS/BLUE SHIELD | Admitting: Family Medicine

## 2019-11-28 ENCOUNTER — Ambulatory Visit: Payer: Self-pay | Admitting: Adult Health

## 2019-11-29 ENCOUNTER — Encounter: Payer: Self-pay | Admitting: Family Medicine

## 2019-11-29 ENCOUNTER — Ambulatory Visit (INDEPENDENT_AMBULATORY_CARE_PROVIDER_SITE_OTHER): Payer: BC Managed Care – PPO | Admitting: Family Medicine

## 2019-11-29 ENCOUNTER — Other Ambulatory Visit: Payer: Self-pay

## 2019-11-29 VITALS — BP 122/80 | HR 84 | Temp 97.8°F | Resp 16 | Ht 67.0 in | Wt 240.0 lb

## 2019-11-29 DIAGNOSIS — E785 Hyperlipidemia, unspecified: Secondary | ICD-10-CM

## 2019-11-29 DIAGNOSIS — I10 Essential (primary) hypertension: Secondary | ICD-10-CM

## 2019-11-29 DIAGNOSIS — E119 Type 2 diabetes mellitus without complications: Secondary | ICD-10-CM

## 2019-11-29 DIAGNOSIS — G4733 Obstructive sleep apnea (adult) (pediatric): Secondary | ICD-10-CM

## 2019-11-29 DIAGNOSIS — Z9989 Dependence on other enabling machines and devices: Secondary | ICD-10-CM

## 2019-11-29 NOTE — Progress Notes (Signed)
   Subjective:    [name], male    DOB: [birth year], 59 y.o.   MRN: [id number]  Patient presents for HTN (noted BP elevated at DOT PE)  Pt here for HTN. He had DOT exam back in January and his blood pressure was quite elevated.  He was given a 83-month card.  There was also a discrepancy about him being insulin-dependent diabetic which she is not.  This was performed with Dr. Andree Elk at the occupational health in [facility].   DM- last A1C 7.1%, ,highest CBG  220, last fasting 168 , no hypoglycemia symptoms    Taking jardiance, actos, metformin, victoza as prescribed.  He is due for repeat A1c this needs to be faxed to the DOT office.    HTN -taking lisinipril10 mg day.  He believes he was under a lot of stress when he went to the visit.  He was having some marital issues which have now improved.  He has been checking his blood pressure and it has been good since then.   My Eye doctor- April 20th   Review Of Systems:  GEN- denies fatigue, fever, weight loss,weakness, recent illness HEENT- denies eye drainage, change in vision, nasal discharge, CVS- denies chest pain, palpitations RESP- denies SOB, cough, wheeze ABD- denies N/V, change in stools, abd pain GU- denies dysuria, hematuria, dribbling, incontinence MSK- denies joint pain, muscle aches, injury Neuro- denies headache, dizziness, syncope, seizure activity       Objective:    BP 122/80   Pulse 84   Temp 97.8 F (36.6 C) (Temporal)   Resp 16   Ht 5\' 7"  (1.702 m)   Wt 240 lb (108.9 kg)   SpO2 99%   BMI 37.59 kg/m  GEN- NAD, alert and oriented x3 HEENT- PERRL, EOMI, non injected sclera, pink conjunctiva, MMM, oropharynx clear Neck- Supple, no thyromegaly CVS- RRR, no murmur RESP-CTAB ABD-NABS,soft,NT,ND EXT- No edema Pulses- Radial, DP- 2+        Assessment & Plan:      Problem List Items Addressed This Visit      Unprioritized   Essential hypertension, benign - Primary   Blood pressure is normal in the office today.  Continue to monitor at home.  No changes to his lisinopril today peer we will check his renal function metabolic panel.  We will also check A1c for his diabetes mellitus goal is less than 7%.  He is also on statin drug for his cholesterol we will check a lipid panel.  He is not insulin-dependent and therefore I will obtain the records with regards to this documentation and provide my own documentation stating this.      Relevant Orders   Comprehensive metabolic panel   CBC with Differential/Platelet   Hyperlipidemia   Relevant Orders   Lipid panel   OSA on CPAP   Type 2 diabetes mellitus without complications ([place])    Per above continue current regimen oral medications along with Victoza which is GLP-1 therapy.  He is also scheduled with the eye doctor.      Relevant Orders   Hemoglobin A1c      Note: This dictation was prepared with Dragon dictation along with smaller phrase technology. Any transcriptional errors that result from this process are unintentional.

## 2019-11-29 NOTE — Assessment & Plan Note (Signed)
Blood pressure is normal in the office today.  Continue to monitor at home.  No changes to his lisinopril today peer we will check his renal function metabolic panel.  We will also check A1c for his diabetes mellitus goal is less than 7%.  He is also on statin drug for his cholesterol we will check a lipid panel.  He is not insulin-dependent and therefore I will obtain the records with regards to this documentation and provide my own documentation stating this.

## 2019-11-29 NOTE — Assessment & Plan Note (Signed)
Per above continue current regimen oral medications along with Victoza which is GLP-1 therapy.  He is also scheduled with the eye doctor.

## 2019-11-29 NOTE — Patient Instructions (Addendum)
F/U 4 months for Physical We will call with results I will obtain records from [place]- Dr. Andree Elk

## 2019-11-30 ENCOUNTER — Encounter: Payer: Self-pay | Admitting: Family Medicine

## 2019-11-30 LAB — LIPID PANEL
Cholesterol: 149 mg/dL (ref <200)
HDL: 41 mg/dL (ref >=40)
LDL Cholesterol (Calc): 91 mg/dL (calc)
Non-HDL Cholesterol (Calc): 108 mg/dL (calc) (ref <130)
Total CHOL/HDL Ratio: 3.6 (calc) (ref <5.0)
Triglycerides: 76 mg/dL (ref <150)

## 2019-11-30 LAB — CBC WITH DIFFERENTIAL/PLATELET
Absolute Monocytes: 823 cells/uL (ref 200–950)
Basophils Absolute: 62 cells/uL (ref 0–200)
Basophils Relative: 1.1 %
Eosinophils Absolute: 302 cells/uL (ref 15–500)
Eosinophils Relative: 5.4 %
HCT: 48.1 % (ref 38.5–50.0)
Hemoglobin: 15.9 g/dL (ref 13.2–17.1)
Lymphs Abs: 1982 cells/uL (ref 850–3900)
MCH: 29.2 pg (ref 27.0–33.0)
MCHC: 33.1 g/dL (ref 32.0–36.0)
MCV: 88.4 fL (ref 80.0–100.0)
MPV: 10.6 fL (ref 7.5–12.5)
Monocytes Relative: 14.7 %
Neutro Abs: 2430 cells/uL (ref 1500–7800)
Neutrophils Relative %: 43.4 %
Platelets: 198 10*3/uL (ref 140–400)
RBC: 5.44 10*6/uL (ref 4.20–5.80)
RDW: 13.1 % (ref 11.0–15.0)
Total Lymphocyte: 35.4 %
WBC: 5.6 10*3/uL (ref 3.8–10.8)

## 2019-11-30 LAB — COMPREHENSIVE METABOLIC PANEL
AG Ratio: 1.6 (calc) (ref 1.0–2.5)
ALT: 17 U/L (ref 9–46)
AST: 24 U/L (ref 10–35)
Albumin: 4.2 g/dL (ref 3.6–5.1)
Alkaline phosphatase (APISO): 86 U/L (ref 35–144)
BUN/Creatinine Ratio: 15 (calc) (ref 6–22)
BUN: 22 mg/dL (ref 7–25)
CO2: 29 mmol/L (ref 20–32)
Calcium: 11.9 mg/dL — ABNORMAL HIGH (ref 8.6–10.3)
Chloride: 103 mmol/L (ref 98–110)
Creat: 1.47 mg/dL — ABNORMAL HIGH (ref 0.70–1.33)
Globulin: 2.7 g/dL (calc) (ref 1.9–3.7)
Glucose, Bld: 97 mg/dL (ref 65–99)
Potassium: 4.5 mmol/L (ref 3.5–5.3)
Sodium: 138 mmol/L (ref 135–146)
Total Bilirubin: 0.7 mg/dL (ref 0.2–1.2)
Total Protein: 6.9 g/dL (ref 6.1–8.1)

## 2019-11-30 LAB — HEMOGLOBIN A1C
Hgb A1c MFr Bld: 7.9 % of total Hgb — ABNORMAL HIGH (ref <5.7)
Mean Plasma Glucose: 180 (calc)
eAG (mmol/L): 10 (calc)

## 2019-12-01 ENCOUNTER — Other Ambulatory Visit: Payer: Self-pay | Admitting: *Deleted

## 2019-12-01 DIAGNOSIS — E349 Endocrine disorder, unspecified: Secondary | ICD-10-CM

## 2019-12-02 ENCOUNTER — Telehealth: Payer: Self-pay | Admitting: *Deleted

## 2019-12-02 NOTE — Telephone Encounter (Signed)
Faxed letter to Dr. Andree Elk.

## 2019-12-02 NOTE — Telephone Encounter (Signed)
Received call from patient.   Reports that he received call from Dr. Andree Elk in regards to DOT PE. States that letter faxed from MD does not state that BP is WNL and well managed.  Requested PCP to addend letter for DOT.

## 2019-12-02 NOTE — Telephone Encounter (Signed)
Letter addended.

## 2019-12-05 DIAGNOSIS — Z0289 Encounter for other administrative examinations: Secondary | ICD-10-CM

## 2019-12-13 ENCOUNTER — Ambulatory Visit: Payer: BC Managed Care – PPO | Admitting: Family Medicine

## 2019-12-13 ENCOUNTER — Other Ambulatory Visit: Payer: Self-pay

## 2019-12-13 ENCOUNTER — Ambulatory Visit: Payer: BC Managed Care – PPO | Admitting: Urology

## 2019-12-13 ENCOUNTER — Encounter: Payer: Self-pay | Admitting: Family Medicine

## 2019-12-13 ENCOUNTER — Ambulatory Visit (HOSPITAL_COMMUNITY)
Admission: RE | Admit: 2019-12-13 | Discharge: 2019-12-13 | Disposition: A | Discharge status: Home / Self Care | Payer: BC Managed Care – PPO | Source: Ambulatory Visit | Attending: Family Medicine | Admitting: Family Medicine

## 2019-12-13 DIAGNOSIS — M25512 Pain in left shoulder: Secondary | ICD-10-CM | POA: Diagnosis not present

## 2019-12-13 DIAGNOSIS — Y929 Unspecified place or not applicable: Secondary | ICD-10-CM | POA: Insufficient documentation

## 2019-12-13 DIAGNOSIS — M79642 Pain in left hand: Secondary | ICD-10-CM | POA: Diagnosis not present

## 2019-12-13 DIAGNOSIS — Y93I9 Activity, other involving external motion: Secondary | ICD-10-CM | POA: Insufficient documentation

## 2019-12-13 DIAGNOSIS — S4992XA Unspecified injury of left shoulder and upper arm, initial encounter: Secondary | ICD-10-CM | POA: Diagnosis not present

## 2019-12-13 LAB — HM DIABETES EYE EXAM
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
## 2019-12-13 NOTE — Patient Instructions (Addendum)
Get xray done [place]

## 2019-12-27 ENCOUNTER — Ambulatory Visit: Payer: BC Managed Care – PPO | Attending: Internal Medicine

## 2019-12-27 DIAGNOSIS — Z23 Encounter for immunization: Secondary | ICD-10-CM

## 2019-12-27 NOTE — Progress Notes (Signed)
   Covid-19 Vaccination Clinic  Name:  Scott Perez    MRN: [id number] DOB: 1960-10-03  12/27/2019  [name] was observed post Covid-19 immunization for 15 minutes without incident. He was provided with Vaccine Information Sheet and instruction to access the V-Safe system.   [name] was instructed to call 911 with any severe reactions post vaccine: Marland Kitchen Difficulty breathing  . Swelling of face and throat  . A fast heartbeat  . A bad rash all over body  . Dizziness and weakness   Immunizations Administered    Name Date Dose VIS Date Route   Moderna COVID-19 Vaccine 12/27/2019  9:04 AM 0.5 mL 07/2019 Intramuscular   Manufacturer: Moderna   Lot: [id number]   [id number]

## 2020-01-02 ENCOUNTER — Other Ambulatory Visit: Payer: Self-pay | Admitting: Family Medicine

## 2020-02-05 ENCOUNTER — Encounter: Payer: Self-pay | Admitting: Adult Health

## 2020-02-09 ENCOUNTER — Other Ambulatory Visit: Payer: Self-pay | Admitting: Family Medicine

## 2020-02-09 NOTE — Telephone Encounter (Signed)
Ok to refill sildenafil??

## 2020-02-10 MED ORDER — SILDENAFIL CITRATE 100 MG PO TABS: 100.0000 mg | ORAL_TABLET | ORAL | 0 refills | Status: DC | PRN

## 2020-02-10 MED ORDER — METFORMIN HCL 1000 MG PO TABS: 1000.0000 mg | ORAL_TABLET | Freq: Two times a day (BID) | ORAL | 1 refills | Status: DC

## 2020-02-21 ENCOUNTER — Ambulatory Visit (INDEPENDENT_AMBULATORY_CARE_PROVIDER_SITE_OTHER): Payer: BC Managed Care – PPO | Admitting: Adult Health

## 2020-02-21 ENCOUNTER — Encounter: Payer: Self-pay | Admitting: Adult Health

## 2020-02-21 ENCOUNTER — Other Ambulatory Visit: Payer: Self-pay

## 2020-02-21 VITALS — BP 160/93 | HR 84 | Ht 67.0 in | Wt 239.0 lb

## 2020-02-21 DIAGNOSIS — Z9989 Dependence on other enabling machines and devices: Secondary | ICD-10-CM | POA: Diagnosis not present

## 2020-02-21 DIAGNOSIS — G4733 Obstructive sleep apnea (adult) (pediatric): Secondary | ICD-10-CM | POA: Diagnosis not present

## 2020-02-21 NOTE — Progress Notes (Signed)
PATIENT: Scott Perez DOB: April 15, 1961  REASON FOR VISIT: follow up HISTORY FROM: patient  HISTORY OF PRESENT ILLNESS: Today 02/21/20:  Scott Perez is a 59 year old male with a history of obstructive sleep apnea on CPAP.  He uses machine 15 out of 30 days for compliance of 50%.  He uses machine greater than 4 hours only 8 days for compliance of 27%.  On average he uses his machine 3 hours and 27 minutes.  His residual AHI is 1 on 6-12 cmH2O with EPR 2.  Leak in the 95th percentile is 18.2 L/min.  Reports that he dropped his machine and has not been working correctly.  Therefore he has not been using it consistently this month.  He plans to go by his DME company today.  HISTORY 11/17/18 Scott Perez is a 59 y.o. male here today for follow up.  He reports that he is doing well with CPAP.  Using his machine every night.  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  He denies any concerns at this time he does wish to consider changing his mask.  He states that he would like to consider using a nasal mask versus a full facemask.  We have reviewed his download report dated 2/20 11/14/2018.  Compliance Report reveals: Compliance Payor [employer] [place] Usage 10/16/2018 - 11/14/2018 Usage days 28/30 days (93%) >= 4 hours 25 days (83%) < 4 hours 3 days (10%) Usage hours 152 hours 21 minutes Average usage (total days) 5 hours 5 minutes Average usage (days used) 5 hours 26 minutes Median usage (days used) 5 hours 5 minutes Total used hours (value since last reset - 11/14/2018) 738 hours  REVIEW OF SYSTEMS: Out of a complete 14 system review of symptoms, the patient complains only of the following symptoms, and all other reviewed systems are negative.  FSS 22  ESS 3  ALLERGIES: No Known Allergies  HOME MEDICATIONS: Outpatient Medications Prior to Visit  Medication Sig Dispense Refill  . aspirin 81 MG tablet Take 81 mg by mouth 3 (three) times a week.      Marland Kitchen atorvastatin (LIPITOR) 40 MG tablet Take 1 tablet (40 mg total) by mouth daily. 90 tablet 3  . empagliflozin (JARDIANCE) 25 MG TABS tablet Take 25 mg by mouth daily. 90 tablet 3  . lisinopril (ZESTRIL) 10 MG tablet Take 1 tablet (10 mg total) by mouth daily. 90 tablet 3  . metFORMIN (GLUCOPHAGE) 1000 MG tablet Take 1 tablet (1,000 mg total) by mouth 2 (two) times daily with a meal. 180 tablet 1  . pioglitazone (ACTOS) 45 MG tablet Take 1 tablet (45 mg total) by mouth every morning. 30 tablet 5  . sildenafil (VIAGRA) 100 MG tablet Take 1 tablet (100 mg total) by mouth as needed for erectile dysfunction. 6 tablet 0  . tamsulosin (FLOMAX) 0.4 MG CAPS capsule Take 1 capsule (0.4 mg total) by mouth daily. 90 capsule 0  . VICTOZA 18 MG/3ML SOPN INJECT 1.8 MG SUBCUTANEOUSLY ONCE DAILY 27 mL 0   No facility-administered medications prior to visit.    PAST MEDICAL HISTORY: Past Medical History:  Diagnosis Date  . BPH (benign prostatic hypertrophy)   . Diabetes mellitus   . H. pylori infection 12/2011   treated with prevpac  . History of kidney stones   . Hyperlipidemia   . Hypertension   . Sleep apnea     PAST SURGICAL HISTORY: Past Surgical History:  Procedure  Laterality Date  . COLONOSCOPY N/A 08/06/2018   Procedure: COLONOSCOPY;  Surgeon: Daneil Dolin, MD;  [facility];  Service: Endoscopy;  Laterality: N/A;  8:30  . cyst left wrist    . left foot  2004    FAMILY HISTORY: Family History  Problem Relation Age of Onset  . Breast cancer Mother   . Hypertension Father   . Colon polyps Brother   . Colon cancer Brother   . Colon cancer Paternal Uncle     SOCIAL HISTORY: Social History   Socioeconomic History  . Marital status: Married    Spouse name: Not on file  . Number of children: 3  . Years of education: Not on file  . Highest education level: Not on file  Occupational History  . Occupation: truck Geophysicist/field seismologist  Tobacco Use  . Smoking status: Former Smoker     Packs/day: 1.00    Years: 18.00    Pack years: 18.00    Types: Cigarettes    Quit date: 12/19/1995    Years since quitting: 24.1  . Smokeless tobacco: Never Used  Vaping Use  . Vaping Use: Never used  Substance and Sexual Activity  . Alcohol use: No  . Drug use: No  . Sexual activity: Yes  Other Topics Concern  . Not on file  Social History Narrative  . Not on file   Social Determinants of Health   Financial Resource Strain:   . Difficulty of Paying Living Expenses:   Food Insecurity:   . Worried About Charity fundraiser in the Last Year:   . Arboriculturist in the Last Year:   Transportation Needs:   . Film/video editor (Medical):   Marland Kitchen Lack of Transportation (Non-Medical):   Physical Activity:   . Days of Exercise per Week:   . Minutes of Exercise per Session:   Stress:   . Feeling of Stress :   Social Connections:   . Frequency of Communication with Friends and Family:   . Frequency of Social Gatherings with Friends and Family:   . Attends Religious Services:   . Active Member of Clubs or Organizations:   . Attends Archivist Meetings:   Marland Kitchen Marital Status:   Intimate Partner Violence:   . Fear of Current or Ex-Partner:   . Emotionally Abused:   Marland Kitchen Physically Abused:   . Sexually Abused:       PHYSICAL EXAM  Vitals:   02/21/20 0842  BP: (!) 160/93  Pulse: 84  Weight: 239 lb (108.4 kg)  Height: 5\' 7"  (1.702 m)   Body mass index is 37.43 kg/m.  Generalized: Well developed, in no acute distress  Chest: Lungs clear to auscultation bilaterally  Neurological examination  Mentation: Alert oriented to time, place, history taking. Follows all commands speech and language fluent Cranial nerve II-XII: Extraocular movements were full, visual field were full on confrontational test Head turning and shoulder shrug  were normal and symmetric. Motor: The motor testing reveals 5 over 5 strength of all 4 extremities. Good symmetric motor tone is noted  throughout.  Sensory: Sensory testing is intact to soft touch on all 4 extremities. No evidence of extinction is noted.  Gait and station: Gait is normal.    DIAGNOSTIC DATA (LABS, IMAGING, TESTING) - I reviewed patient records, labs, notes, testing and imaging myself where available.  Lab Results  Component Value Date   WBC 5.6 11/29/2019   HGB 15.9 11/29/2019   HCT  48.1 11/29/2019   MCV 88.4 11/29/2019   PLT 198 11/29/2019      Component Value Date/Time   NA 138 11/29/2019 1211   K 4.5 11/29/2019 1211   CL 103 11/29/2019 1211   CO2 29 11/29/2019 1211   GLUCOSE 97 11/29/2019 1211   BUN 22 11/29/2019 1211   CREATININE 1.47 (H) 11/29/2019 1211   CALCIUM 11.9 (H) 11/29/2019 1211   PROT 6.9 11/29/2019 1211   ALBUMIN 4.2 05/09/2016 1213   AST 24 11/29/2019 1211   ALT 17 11/29/2019 1211   ALKPHOS 77 05/09/2016 1213   BILITOT 0.7 11/29/2019 1211   GFRNONAA 57 (L) 04/27/2018 0811   GFRAA 66 04/27/2018 0811   Lab Results  Component Value Date   CHOL 149 11/29/2019   HDL 41 11/29/2019   LDLCALC 91 11/29/2019   LDLDIRECT 87 07/15/2019   TRIG 76 11/29/2019   CHOLHDL 3.6 11/29/2019   Lab Results  Component Value Date   HGBA1C 7.9 (H) 11/29/2019   No results found for: VITAMINB12 Lab Results  Component Value Date   TSH 0.74 11/28/2015      ASSESSMENT AND PLAN 59 y.o. year old male  has a past medical history of BPH (benign prostatic hypertrophy), Diabetes mellitus, H. pylori infection (12/2011), History of kidney stones, Hyperlipidemia, Hypertension, and Sleep apnea. here with:  1. OSA on CPAP  - CPAP compliance is suboptimal - Good treatment of AHI  - Encourage patient to use CPAP nightly and > 4 hours each night - Mask refitting ordered.  Advised that DME company can look at his machine - F/U in 1 year or sooner if needed   I spent 20 minutes of face-to-face and non-face-to-face time with patient.  This included previsit chart review, lab review, study review,  order entry, electronic health record documentation, patient education.  Ward Givens, MSN, NP-C 02/21/2020, 9:02 AM [facility] [address], [place], [place] [address] [phone]

## 2020-02-21 NOTE — Patient Instructions (Signed)
Your Plan:  Continue using CPAP nightly and greater than 4 hours each night  order sent for mask refitting If your symptoms worsen or you develop new symptoms please let us know.    Thank you for coming to see [place] at [facility]. I hope we have been able to provide you high quality care today.  You may receive a patient satisfaction survey over the next few weeks. We would appreciate your feedback and comments so that we may continue to improve ourselves and the health of our patients.

## 2020-02-21 NOTE — Progress Notes (Signed)
Order for mask refitting/cpap supplies sent to [facility] via community msg. Confirmation received that the order transmitted was successful.

## 2020-02-22 DIAGNOSIS — G4733 Obstructive sleep apnea (adult) (pediatric): Secondary | ICD-10-CM | POA: Diagnosis not present

## 2020-03-14 ENCOUNTER — Telehealth: Payer: Self-pay | Admitting: Adult Health

## 2020-03-14 NOTE — Telephone Encounter (Signed)
Dr. Marjo Bicker- DOT examiner called requesting  Last office note for this patient.   [id number] # [id number] Phone # [phone]

## 2020-03-15 NOTE — Telephone Encounter (Signed)
Done faxed on 03/14/21

## 2020-03-16 NOTE — Telephone Encounter (Signed)
Dr. Andree Elk left a message for Ward Givens NP; Did not receive your note, maybe on Monday we can take care of it then. Please fax your note for the Pt.

## 2020-03-19 NOTE — Telephone Encounter (Addendum)
Refaxed this am to # listed.  Received fax confirmation.

## 2020-09-04 ENCOUNTER — Telehealth: Payer: BC Managed Care – PPO | Admitting: Adult Health

## 2020-09-04 ENCOUNTER — Telehealth: Payer: Self-pay

## 2020-09-04 NOTE — Telephone Encounter (Signed)
Attempted to call pt about cpap data, LVM
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
## 2022-02-20 DIAGNOSIS — H35033 Hypertensive retinopathy, bilateral: Secondary | ICD-10-CM | POA: Diagnosis not present

## 2022-02-20 DIAGNOSIS — Z7984 Long term (current) use of oral hypoglycemic drugs: Secondary | ICD-10-CM | POA: Diagnosis not present

## 2022-04-25 DIAGNOSIS — Z8639 Personal history of other endocrine, nutritional and metabolic disease: Secondary | ICD-10-CM | POA: Diagnosis not present

## 2022-04-25 DIAGNOSIS — R399 Unspecified symptoms and signs involving the genitourinary system: Secondary | ICD-10-CM | POA: Diagnosis not present

## 2022-04-25 DIAGNOSIS — R81 Glycosuria: Secondary | ICD-10-CM | POA: Diagnosis not present

## 2022-04-25 DIAGNOSIS — R350 Frequency of micturition: Secondary | ICD-10-CM | POA: Diagnosis not present

## 2022-05-12 ENCOUNTER — Encounter: Payer: Self-pay | Admitting: Family Medicine

## 2022-05-12 ENCOUNTER — Ambulatory Visit: Payer: BC Managed Care – PPO | Admitting: Family Medicine

## 2022-05-12 VITALS — BP 160/92 | HR 82 | Ht 67.0 in | Wt 189.1 lb

## 2022-05-12 DIAGNOSIS — N401 Enlarged prostate with lower urinary tract symptoms: Secondary | ICD-10-CM | POA: Diagnosis not present

## 2022-05-12 DIAGNOSIS — N3001 Acute cystitis with hematuria: Secondary | ICD-10-CM | POA: Diagnosis not present

## 2022-05-12 DIAGNOSIS — E1140 Type 2 diabetes mellitus with diabetic neuropathy, unspecified: Secondary | ICD-10-CM

## 2022-05-12 DIAGNOSIS — E785 Hyperlipidemia, unspecified: Secondary | ICD-10-CM | POA: Diagnosis not present

## 2022-05-12 DIAGNOSIS — I10 Essential (primary) hypertension: Secondary | ICD-10-CM

## 2022-05-12 DIAGNOSIS — E119 Type 2 diabetes mellitus without complications: Secondary | ICD-10-CM | POA: Diagnosis not present

## 2022-05-12 DIAGNOSIS — R369 Urethral discharge, unspecified: Secondary | ICD-10-CM

## 2022-05-12 DIAGNOSIS — E7849 Other hyperlipidemia: Secondary | ICD-10-CM

## 2022-05-12 DIAGNOSIS — Z23 Encounter for immunization: Secondary | ICD-10-CM

## 2022-05-12 DIAGNOSIS — R319 Hematuria, unspecified: Secondary | ICD-10-CM

## 2022-05-12 DIAGNOSIS — E559 Vitamin D deficiency, unspecified: Secondary | ICD-10-CM

## 2022-05-12 DIAGNOSIS — R3912 Poor urinary stream: Secondary | ICD-10-CM

## 2022-05-12 DIAGNOSIS — N12 Tubulo-interstitial nephritis, not specified as acute or chronic: Secondary | ICD-10-CM

## 2022-05-12 LAB — POCT URINALYSIS DIP (CLINITEK)
Bilirubin, UA: NEGATIVE
Glucose, UA: 500 mg/dL — AB
Ketones, POC UA: NEGATIVE mg/dL
Leukocytes, UA: NEGATIVE
Nitrite, UA: NEGATIVE
POC PROTEIN,UA: 30 — AB
Spec Grav, UA: 1.015 (ref 1.010–1.025)
Urobilinogen, UA: 0.2 E.U./dL
pH, UA: 7 (ref 5.0–8.0)

## 2022-05-12 MED ORDER — ATORVASTATIN CALCIUM 40 MG PO TABS: 40.0000 mg | ORAL_TABLET | Freq: Every day | ORAL | 3 refills | Status: DC

## 2022-05-12 MED ORDER — METFORMIN HCL 1000 MG PO TABS: 1000.0000 mg | ORAL_TABLET | Freq: Two times a day (BID) | ORAL | 1 refills | Status: DC

## 2022-05-12 MED ORDER — LISINOPRIL 10 MG PO TABS: 10.0000 mg | ORAL_TABLET | Freq: Every day | ORAL | 3 refills | Status: DC

## 2022-05-12 MED ORDER — GABAPENTIN 100 MG PO CAPS: 100.0000 mg | ORAL_CAPSULE | Freq: Every day | ORAL | 3 refills | Status: DC

## 2022-05-12 MED ORDER — TAMSULOSIN HCL 0.4 MG PO CAPS: 0.4000 mg | ORAL_CAPSULE | Freq: Every day | ORAL | 0 refills | Status: DC

## 2022-05-12 NOTE — Patient Instructions (Signed)
I appreciate the opportunity to provide care to you today!    Follow up:  3 weeks  Labs: please stop by the lab today to get your blood drawn (CBC, CMP, TSH, Lipid profile, HgA1c, Vit D)   Please pick up your medications at the pharmacy  Please stop by [facility] hospital anytime to get an u/s of your kidneys    Referrals today- urology   Please continue to a heart-healthy diet and increase your physical activities. Try to exercise for 65mns at least three times a week.      It was a pleasure to see you and I look forward to continuing to work together on your health and well-being. Please do not hesitate to call the office if you need care or have questions about your care.   Have a wonderful day and week. With Gratitude, GAlvira MondayMSN, FNP-BC

## 2022-05-12 NOTE — Assessment & Plan Note (Signed)
Pending labs

## 2022-05-12 NOTE — Assessment & Plan Note (Addendum)
Reports that he used to take Linispril 10 mg daily but has been off medication for one year He denies dizziness, headaches, chest pain, and visual disturbances. Will reinstate his lisinopril and f/u in 3 weeks Dash diet reviewed Low sodium intake reviewed, recommending less than 1500 mg daily intake of sodium Encouraged increased physical activities

## 2022-05-12 NOTE — Assessment & Plan Note (Signed)
Denies the 3ps of diabetes He reports taking Pioglitazone, Victoza, Jardiance, and metformin and reports seeing Dr. Buelah Manis but has not had a PCP for one year and has not been on his treatments for a year  Pending HgA1C Will reinstate metformin and make treatment adjustments based on hga1c

## 2022-05-12 NOTE — Progress Notes (Signed)
New Patient Office Visit  Subjective:  [name], male    DOB: 02-26-1961  Age: 61 y.o. MRN: [id number]  CC:  Chief Complaint  Patient presents with   New Patient (Initial Visit)    Establishing care, states he had blood in urine in 3 weeks ago that had been ongoing for 3 weeks, pt also c/o low back pain onset 04/11/22, would like to have blood work done today.      HPI Scott Perez is a 61 y.o. male with past medical history of BPH, HTN, and  T2DM presents for establishing care. Pyelonephritis: he reports recently being treated for five days with antibiotics for pyelonephritis. He denies fever and chills, noting that he still has hematuria and back pain.  Diabetic neuropathy: c/o numbness and tingling in his hands and feet, which worsens at night. He denies the 3ps of diabetes.He reports taking Pioglitazone, Victoza, Jardiance, and metformin and reports seeing Dr. Buelah Manis but has not had a PCP for one year and has not been on his treatments for a year. BPH: reports urinary symptoms of urgency, frequency, and weak urinary stream. He has been out of his flomax for a year.  HTN: reports that he used to take Linispril 10 mg daily but has been off medication for one year. He denies dizziness, headaches, chest pain, and visual disturbances.   Past Medical History:  Diagnosis Date   BPH (benign prostatic hypertrophy)    Diabetes mellitus    H. pylori infection 12/2011   treated with prevpac   History of kidney stones    Hyperlipidemia    Hypertension    Sleep apnea     Past Surgical History:  Procedure Laterality Date   COLONOSCOPY N/A 08/06/2018   Procedure: COLONOSCOPY;  Surgeon: Daneil Dolin, MD;  Location: [facility];  Service: Endoscopy;  Laterality: N/A;  8:30   cyst left wrist     left foot  2004    Family History  Problem Relation Age of Onset   Breast cancer Mother    Hypertension Father    Colon polyps Brother    Colon cancer Brother     Colon cancer Paternal Uncle     Social History   Socioeconomic History   Marital status: Married    Spouse name: Not on file   Number of children: 3   Years of education: Not on file   Highest education level: Not on file  Occupational History   Occupation: truck driver  Tobacco Use   Smoking status: Former    Packs/day: 1.00    Years: 18.00    Total pack years: 18.00    Types: Cigarettes    Quit date: 12/19/1995    Years since quitting: 26.4   Smokeless tobacco: Never  Vaping Use   Vaping Use: Never used  Substance and Sexual Activity   Alcohol use: No   Drug use: No   Sexual activity: Yes  Other Topics Concern   Not on file  Social History Narrative   Not on file   Social Determinants of Health   Financial Resource Strain: Not on file  Food Insecurity: Not on file  Transportation Needs: Not on file  Physical Activity: Not on file  Stress: Not on file  Social Connections: Not on file  Intimate Partner Violence: Not on file    ROS Review of Systems  Constitutional:  Negative for chills, fatigue and fever.  HENT:  Negative for rhinorrhea, sinus  pressure and sinus pain.   Eyes:  Negative for photophobia, pain and redness.  Respiratory:  Negative for chest tightness and shortness of breath.   Cardiovascular:  Negative for chest pain and palpitations.  Gastrointestinal:  Negative for diarrhea, nausea and vomiting.  Endocrine: Negative for polydipsia, polyphagia and polyuria.  Genitourinary:  Positive for hematuria.  Musculoskeletal:  Positive for back pain.  Skin:  Negative for rash and wound.  Neurological:  Positive for light-headedness and numbness. Negative for dizziness and headaches.  Psychiatric/Behavioral:  Negative for self-injury and suicidal ideas.     Objective:   Today's Vitals: BP (!) 160/92 (BP Location: Left Arm)   Pulse 82   Ht _0  (1.702 m)   Wt 189 lb 1.9 oz (85.8 kg)   SpO2 96%   BMI 29.62 kg/m   Physical Exam HENT:     Head:  Normocephalic.     Right Ear: External ear normal.     Left Ear: External ear normal.     Nose: No congestion.     Mouth/Throat:     Mouth: Mucous membranes are moist.  Eyes:     Extraocular Movements: Extraocular movements intact.     Pupils: Pupils are equal, round, and reactive to light.  Cardiovascular:     Rate and Rhythm: Normal rate and regular rhythm.     Pulses: Normal pulses.     Heart sounds: Normal heart sounds.  Pulmonary:     Effort: Pulmonary effort is normal.     Breath sounds: Normal breath sounds.  Abdominal:     Palpations: Abdomen is soft.     Tenderness: There is no right CVA tenderness or left CVA tenderness.  Musculoskeletal:     Right lower leg: No edema.     Left lower leg: No edema.  Lymphadenopathy:     Cervical: No cervical adenopathy.  Skin:    Findings: No lesion or rash.  Neurological:     Mental Status: He is alert and oriented to person, place, and time.  Psychiatric:     Comments: Normal affect     Assessment & Plan:   Problem List Items Addressed This Visit       Cardiovascular and Mediastinum   Essential hypertension, benign    Reports that he used to take Linispril 10 mg daily but has been off medication for one year He denies dizziness, headaches, chest pain, and visual disturbances. Will reinstate his lisinopril and f/u in 3 weeks Dash diet reviewed Low sodium intake reviewed, recommending less than 1500 mg daily intake of sodium Encouraged increased physical activities      Relevant Medications   atorvastatin (LIPITOR) 40 MG tablet   lisinopril (ZESTRIL) 10 MG tablet     Endocrine   Type 2 diabetes mellitus without complications ([place]) - Primary    Denies the 3ps of diabetes He reports taking Pioglitazone, Victoza, Jardiance, and metformin and reports seeing Dr. Buelah Manis but has not had a PCP for one year and has not been on his treatments for a year  Pending HgA1C Will reinstate metformin and make treatment adjustments  based on hga1c      Relevant Medications   metFORMIN (GLUCOPHAGE) 1000 MG tablet   atorvastatin (LIPITOR) 40 MG tablet   lisinopril (ZESTRIL) 10 MG tablet   Other Relevant Orders   CBC with Differential/Platelet   CMP14+EGFR   TSH + free T4   Lipid panel   Hemoglobin A1C   Microalbumin / creatinine urine ratio  Genitourinary   BPH (benign prostatic hyperplasia)    Reports urinary symptoms of urgency, frequency, and weak urinary stream He has been out of his flomax for a year Will reinstate Flomax      Relevant Medications   tamsulosin (FLOMAX) 0.4 MG CAPS capsule   Pyelonephritis    UA negative for nitrates and leukocytes with a trace of blood Pt c/o of back pain and hematuria Pending culture Negative CVA tenderness He denies fever or chills Will get a renal U/S to assess for structural  abnormalities and r/o nephrolithiasis Will refer to urology for further evaluation         Other   Hyperlipidemia    Pending labs      Relevant Medications   atorvastatin (LIPITOR) 40 MG tablet   lisinopril (ZESTRIL) 10 MG tablet   Other Visit Diagnoses     Flu vaccine need       Relevant Orders   Flu Vaccine QUAD 6+ mos PF IM (Fluarix Quad PF) (Completed)   Urethral discharge in male, with blood       Hematuria, unspecified type       Relevant Orders   POCT URINALYSIS DIP (CLINITEK) (Completed)   Hematuria due to acute cystitis       Relevant Orders   US Renal   Ambulatory referral to Urology   Urine Culture   Type 2 diabetes mellitus with diabetic neuropathy, without long-term current use of insulin (HCC)       Relevant Medications   metFORMIN (GLUCOPHAGE) 1000 MG tablet   atorvastatin (LIPITOR) 40 MG tablet   gabapentin (NEURONTIN) 100 MG capsule   lisinopril (ZESTRIL) 10 MG tablet   Vitamin D deficiency       Relevant Orders   Vitamin D (25 hydroxy)   Hyperlipidemia LDL goal <70       Relevant Medications   atorvastatin (LIPITOR) 40 MG tablet   lisinopril  (ZESTRIL) 10 MG tablet   Other Relevant Orders   Lipid panel       Outpatient Encounter Medications as of 05/12/2022  Medication Sig   gabapentin (NEURONTIN) 100 MG capsule Take 1 capsule (100 mg total) by mouth at bedtime.   aspirin 81 MG tablet Take 81 mg by mouth 3 (three) times a week.  (Patient not taking: Reported on 05/12/2022)   atorvastatin (LIPITOR) 40 MG tablet Take 1 tablet (40 mg total) by mouth daily.   empagliflozin (JARDIANCE) 25 MG TABS tablet Take 25 mg by mouth daily. (Patient not taking: Reported on 05/12/2022)   lisinopril (ZESTRIL) 10 MG tablet Take 1 tablet (10 mg total) by mouth daily.   metFORMIN (GLUCOPHAGE) 1000 MG tablet Take 1 tablet (1,000 mg total) by mouth 2 (two) times daily with a meal.   pioglitazone (ACTOS) 45 MG tablet Take 1 tablet (45 mg total) by mouth every morning. (Patient not taking: Reported on 05/12/2022)   sildenafil (VIAGRA) 100 MG tablet Take 1 tablet (100 mg total) by mouth as needed for erectile dysfunction. (Patient not taking: Reported on 05/12/2022)   tamsulosin (FLOMAX) 0.4 MG CAPS capsule Take 1 capsule (0.4 mg total) by mouth daily.   VICTOZA 18 MG/3ML SOPN INJECT 1.8 MG SUBCUTANEOUSLY ONCE DAILY (Patient not taking: Reported on 05/12/2022)   [DISCONTINUED] atorvastatin (LIPITOR) 40 MG tablet Take 1 tablet (40 mg total) by mouth daily. (Patient not taking: Reported on 05/12/2022)   [DISCONTINUED] lisinopril (ZESTRIL) 10 MG tablet Take 1 tablet (10 mg total) by mouth daily. (Patient not taking:  Reported on 05/12/2022)   [DISCONTINUED] metFORMIN (GLUCOPHAGE) 1000 MG tablet Take 1 tablet (1,000 mg total) by mouth 2 (two) times daily with a meal. (Patient not taking: Reported on 05/12/2022)   [DISCONTINUED] tamsulosin (FLOMAX) 0.4 MG CAPS capsule Take 1 capsule (0.4 mg total) by mouth daily. (Patient not taking: Reported on 05/12/2022)   No facility-administered encounter medications on file as of 05/12/2022.    Follow-up: Return in about 3 weeks  (around 06/02/2022).   Alvira Monday, FNP

## 2022-05-12 NOTE — Assessment & Plan Note (Signed)
Reports urinary symptoms of urgency, frequency, and weak urinary stream He has been out of his flomax for a year Will reinstate Flomax

## 2022-05-12 NOTE — Assessment & Plan Note (Signed)
UA negative for nitrates and leukocytes with a trace of blood Pt c/o of back pain and hematuria Pending culture Negative CVA tenderness He denies fever or chills Will get a renal U/S to assess for structural  abnormalities and r/o nephrolithiasis Will refer to urology for further evaluation

## 2022-05-13 ENCOUNTER — Encounter: Payer: Self-pay | Admitting: Family Medicine

## 2022-05-13 NOTE — Progress Notes (Signed)
Documentation

## 2022-05-14 ENCOUNTER — Other Ambulatory Visit: Payer: Self-pay | Admitting: Family Medicine

## 2022-05-14 DIAGNOSIS — E119 Type 2 diabetes mellitus without complications: Secondary | ICD-10-CM

## 2022-05-14 DIAGNOSIS — E559 Vitamin D deficiency, unspecified: Secondary | ICD-10-CM

## 2022-05-14 DIAGNOSIS — E785 Hyperlipidemia, unspecified: Secondary | ICD-10-CM

## 2022-05-14 LAB — CBC WITH DIFFERENTIAL/PLATELET
Basophils Absolute: 0.1 10*3/uL (ref 0.0–0.2)
Basos: 1 %
EOS (ABSOLUTE): 0.2 10*3/uL (ref 0.0–0.4)
Eos: 3 %
Hematocrit: 48.6 % (ref 37.5–51.0)
Hemoglobin: 16.2 g/dL (ref 13.0–17.7)
Immature Grans (Abs): 0 10*3/uL (ref 0.0–0.1)
Immature Granulocytes: 0 %
Lymphocytes Absolute: 2.2 10*3/uL (ref 0.7–3.1)
Lymphs: 43 %
MCH: 29.3 pg (ref 26.6–33.0)
MCHC: 33.3 g/dL (ref 31.5–35.7)
MCV: 88 fL (ref 79–97)
Monocytes Absolute: 0.4 10*3/uL (ref 0.1–0.9)
Monocytes: 9 %
Neutrophils Absolute: 2.3 10*3/uL (ref 1.4–7.0)
Neutrophils: 44 %
Platelets: 214 10*3/uL (ref 150–450)
RBC: 5.53 x10E6/uL (ref 4.14–5.80)
RDW: 11.9 % (ref 11.6–15.4)
WBC: 5.1 10*3/uL (ref 3.4–10.8)

## 2022-05-14 LAB — CMP14+EGFR
ALT: 15 IU/L (ref 0–44)
AST: 13 IU/L (ref 0–40)
Albumin/Globulin Ratio: 1.6 (ref 1.2–2.2)
Albumin: 4 g/dL (ref 3.8–4.9)
Alkaline Phosphatase: 118 IU/L (ref 44–121)
BUN/Creatinine Ratio: 11 (ref 10–24)
BUN: 13 mg/dL (ref 8–27)
Bilirubin Total: 0.4 mg/dL (ref 0.0–1.2)
CO2: 25 mmol/L (ref 20–29)
Calcium: 10.6 mg/dL — ABNORMAL HIGH (ref 8.6–10.2)
Chloride: 95 mmol/L — ABNORMAL LOW (ref 96–106)
Creatinine, Ser: 1.17 mg/dL (ref 0.76–1.27)
Globulin, Total: 2.5 g/dL (ref 1.5–4.5)
Glucose: 454 mg/dL — ABNORMAL HIGH (ref 70–99)
Potassium: 4.3 mmol/L (ref 3.5–5.2)
Sodium: 130 mmol/L — ABNORMAL LOW (ref 134–144)
Total Protein: 6.5 g/dL (ref 6.0–8.5)
eGFR: 71 mL/min/{1.73_m2} (ref >59)

## 2022-05-14 LAB — VITAMIN D 25 HYDROXY (VIT D DEFICIENCY, FRACTURES): Vit D, 25-Hydroxy: 16.1 ng/mL — ABNORMAL LOW (ref 30.0–100.0)

## 2022-05-14 LAB — MICROALBUMIN / CREATININE URINE RATIO
Creatinine, Urine: 28.9 mg/dL
Microalb/Creat Ratio: 720 mg/g creat — ABNORMAL HIGH (ref 0–29)
Microalbumin, Urine: 208.1 ug/mL

## 2022-05-14 LAB — LIPID PANEL
Chol/HDL Ratio: 4.4 ratio (ref 0.0–5.0)
Cholesterol, Total: 208 mg/dL — ABNORMAL HIGH (ref 100–199)
HDL: 47 mg/dL (ref >39)
LDL Chol Calc (NIH): 131 mg/dL — ABNORMAL HIGH (ref 0–99)
Triglycerides: 166 mg/dL — ABNORMAL HIGH (ref 0–149)
VLDL Cholesterol Cal: 30 mg/dL (ref 5–40)

## 2022-05-14 LAB — HEMOGLOBIN A1C
Est. average glucose Bld gHb Est-mCnc: >398 mg/dL
Hgb A1c MFr Bld: >15.5 % — ABNORMAL HIGH (ref 4.8–5.6)

## 2022-05-14 LAB — TSH+FREE T4
Free T4: 1.46 ng/dL (ref 0.82–1.77)
TSH: 3.99 u[IU]/mL (ref 0.450–4.500)

## 2022-05-14 LAB — URINE CULTURE: Organism ID, Bacteria: NO GROWTH

## 2022-05-14 NOTE — Progress Notes (Unsigned)
The 10-year ASCVD risk score (Arnett DK, et al., 2019) is: 35.2%   Values used to calculate the score:     Age: 61 years     Sex: Male     Is Non-Hispanic African American: Yes     Diabetic: Yes     Tobacco smoker: No     Systolic Blood Pressure: 080 mmHg     Is BP treated: Yes     HDL Cholesterol: 47 mg/dL     Total Cholesterol: 208 mg/dL

## 2022-05-15 ENCOUNTER — Telehealth: Payer: Self-pay | Admitting: Family Medicine

## 2022-05-15 ENCOUNTER — Other Ambulatory Visit: Payer: Self-pay | Admitting: Family Medicine

## 2022-05-15 DIAGNOSIS — E119 Type 2 diabetes mellitus without complications: Secondary | ICD-10-CM

## 2022-05-15 DIAGNOSIS — E1165 Type 2 diabetes mellitus with hyperglycemia: Secondary | ICD-10-CM

## 2022-05-15 DIAGNOSIS — E785 Hyperlipidemia, unspecified: Secondary | ICD-10-CM

## 2022-05-15 DIAGNOSIS — E559 Vitamin D deficiency, unspecified: Secondary | ICD-10-CM

## 2022-05-15 MED ORDER — VITAMIN D (ERGOCALCIFEROL) 1.25 MG (50000 UNIT) PO CAPS: 50000.0000 [IU] | ORAL_CAPSULE | ORAL | 2 refills | Status: DC

## 2022-05-15 MED ORDER — TIRZEPATIDE 5 MG/0.5ML ~~LOC~~ SOAJ: SUBCUTANEOUS | 0 refills | Status: DC

## 2022-05-15 MED ORDER — DAPAGLIFLOZIN PROPANEDIOL 5 MG PO TABS: 5.0000 mg | ORAL_TABLET | Freq: Every day | ORAL | 1 refills | Status: DC

## 2022-05-15 MED ORDER — METFORMIN HCL 1000 MG PO TABS: 1000.0000 mg | ORAL_TABLET | Freq: Two times a day (BID) | ORAL | 1 refills | Status: DC

## 2022-05-15 MED ORDER — ROSUVASTATIN CALCIUM 10 MG PO TABS: 10.0000 mg | ORAL_TABLET | Freq: Every day | ORAL | 3 refills | Status: DC

## 2022-05-15 NOTE — Telephone Encounter (Signed)
Spoke with Scott Perez pt was prescribed rosuvastatin today atorvastatin d/c'd

## 2022-05-15 NOTE — Progress Notes (Signed)
Please inform the patient that his HgA1c is >15.5. I've started him on farxiga, Mounjaro and refilled his metformin. His cholesterol is elevated, and I've started on Rosuvastatin. His Vit D is low, and a prescription for Vit is sent to his pharmacy. A referral is placed to endocrinology. I recommend low carbs, fat and reducing his intake of high-sugar foods. I also recommend increased physical activity and increasing his intake of fruits and vegetables.

## 2022-05-15 NOTE — Telephone Encounter (Signed)
Scott Perez with [facility], [phone]   Called stating she is needing clarification on a medication. Can you please call her?

## 2022-05-15 NOTE — Progress Notes (Signed)
The 10-year ASCVD risk score (Arnett DK, et al., 2019) is: 35.2%   Values used to calculate the score:     Age: 61 years     Sex: Male     Is Non-Hispanic African American: Yes     Diabetic: Yes     Tobacco smoker: No     Systolic Blood Pressure: 521 mmHg     Is BP treated: Yes     HDL Cholesterol: 47 mg/dL     Total Cholesterol: 208 mg/dL

## 2022-05-16 ENCOUNTER — Encounter: Payer: Self-pay | Admitting: Urology

## 2022-05-16 ENCOUNTER — Other Ambulatory Visit: Payer: Self-pay | Admitting: Family Medicine

## 2022-05-16 ENCOUNTER — Ambulatory Visit: Payer: BC Managed Care – PPO | Admitting: Urology

## 2022-05-16 VITALS — BP 172/89 | HR 92 | Ht 67.0 in | Wt 190.0 lb

## 2022-05-16 DIAGNOSIS — N401 Enlarged prostate with lower urinary tract symptoms: Secondary | ICD-10-CM | POA: Diagnosis not present

## 2022-05-16 DIAGNOSIS — R310 Gross hematuria: Secondary | ICD-10-CM

## 2022-05-16 DIAGNOSIS — E1165 Type 2 diabetes mellitus with hyperglycemia: Secondary | ICD-10-CM

## 2022-05-16 DIAGNOSIS — C61 Malignant neoplasm of prostate: Secondary | ICD-10-CM | POA: Diagnosis not present

## 2022-05-16 DIAGNOSIS — N138 Other obstructive and reflux uropathy: Secondary | ICD-10-CM

## 2022-05-16 LAB — URINALYSIS, ROUTINE W REFLEX MICROSCOPIC
Bilirubin, UA: NEGATIVE
Ketones, UA: NEGATIVE
Leukocytes,UA: NEGATIVE
Nitrite, UA: NEGATIVE
Specific Gravity, UA: 1.01 (ref 1.005–1.030)
Urobilinogen, Ur: 0.2 mg/dL (ref 0.2–1.0)
pH, UA: 5.5 (ref 5.0–7.5)

## 2022-05-16 LAB — MICROSCOPIC EXAMINATION: Bacteria, UA: NONE SEEN

## 2022-05-16 LAB — BLADDER SCAN AMB NON-IMAGING: Scan Result: 288

## 2022-05-16 MED ORDER — TIRZEPATIDE 2.5 MG/0.5ML ~~LOC~~ SOAJ: 2.5000 mg | SUBCUTANEOUS | 0 refills | Status: DC

## 2022-05-16 NOTE — Progress Notes (Signed)
Assessment: 1. Gross hematuria   2. BPH with obstruction/lower urinary tract symptoms   3. Prostate cancer [facility]); low risk disease; biopsy 10/20; on active surveillance     Plan: I personally reviewed the patient's records including prior urology notes from Dr. Diona Fanti as well as notes from his PCP and lab results. Today I had a discussion with the patient regarding the findings of gross hematuria including the implications and differential diagnoses associated with it.  I also discussed recommendations for further evaluation including the rationale for upper tract imaging and cystoscopy.  I discussed the nature of these procedures including potential risk and complications.  The patient expressed an understanding of these issues. Recommend further evaluation with CT hematuria protocol and cystoscopy. PSA today. Resume tamsulosin for BPH    Chief Complaint:  Chief Complaint  Patient presents with   Hematuria    History of Present Illness:  Scott Perez is a 61 y.o. male who is seen in consultation from Alvira Monday, Schaller for evaluation of gross hematuria, lower urinary tract symptoms, and prostate cancer.  He has a recent history of gross hematuria for approximately 1 month.  His last episode of gross hematuria was approximately 1 month ago.  He reports urinary frequency, urgency, nocturia, intermittent stream, decreased force of stream, and hesitancy.  He is not having any dysuria at the present time.  No flank pain. Urine culture from 05/12/2022 showed no growth. He has a remote history of UTI. He was previously on tamsulosin but discontinued.  He has not restarted tamsulosin yet. IPSS = 15 today.  He was previously seen by Dr. Diona Fanti and diagnosed with prostate cancer.  Prostate biopsy from October 2020 was done for a PSA of 4.9.  Prostate volume measured 96 mL.  1 out of 12 cores showed Gleason 3+3 adenocarcinoma.  He was being managed with active surveillance.  He has  not returned for follow-up since January 2021. PSA from 1/21 was 3.4.  Past Medical History:  Past Medical History:  Diagnosis Date   BPH (benign prostatic hypertrophy)    Diabetes mellitus    H. pylori infection 12/2011   treated with prevpac   History of kidney stones    Hyperlipidemia    Hypertension    Sleep apnea     Past Surgical History:  Past Surgical History:  Procedure Laterality Date   COLONOSCOPY N/A 08/06/2018   Procedure: COLONOSCOPY;  Surgeon: Daneil Dolin, MD;  Location: [facility];  Service: Endoscopy;  Laterality: N/A;  8:30   cyst left wrist     left foot  2004    Allergies:  No Known Allergies  Family History:  Family History  Problem Relation Age of Onset   Breast cancer Mother    Hypertension Father    Colon polyps Brother    Colon cancer Brother    Colon cancer Paternal Uncle     Social History:  Social History   Tobacco Use   Smoking status: Former    Packs/day: 1.00    Years: 18.00    Total pack years: 18.00    Types: Cigarettes    Quit date: 12/19/1995    Years since quitting: 26.4   Smokeless tobacco: Never  Vaping Use   Vaping Use: Never used  Substance Use Topics   Alcohol use: No   Drug use: No    Review of symptoms:  Constitutional:  Negative for unexplained weight loss, night sweats, fever, chills ENT:  Negative for nose  bleeds, sinus pain, painful swallowing CV:  Negative for chest pain, shortness of breath, exercise intolerance, palpitations, loss of consciousness Resp:  Negative for cough, wheezing, shortness of breath GI:  Negative for nausea, vomiting, diarrhea, bloody stools GU:  Positives noted in HPI; otherwise negative for dysuria, urinary incontinence Neuro:  Negative for seizures, poor balance, limb weakness, slurred speech Psych:  Negative for lack of energy, depression, anxiety Endocrine:  Negative for polydipsia, polyuria, symptoms of hypoglycemia (dizziness, hunger, sweating) Hematologic:  Negative  for anemia, purpura, petechia, prolonged or excessive bleeding, use of anticoagulants  Allergic:  Negative for difficulty breathing or choking as a result of exposure to anything; no shellfish allergy; no allergic response (rash/itch) to materials, foods  Physical exam: BP (!) 172/89   Pulse 92   Ht '5\' 7"'$  (1.702 m)   Wt 190 lb (86.2 kg)   BMI 29.76 kg/m  GENERAL APPEARANCE:  Well appearing, well developed, well nourished, NAD HEENT: Atraumatic, Normocephalic, oropharynx clear. NECK: Supple without lymphadenopathy or thyromegaly. LUNGS: Clear to auscultation bilaterally. HEART: Regular Rate and Rhythm without murmurs, gallops, or rubs. ABDOMEN: Soft, non-tender, No Masses. EXTREMITIES: Moves all extremities well.  Without clubbing, cyanosis, or edema. NEUROLOGIC:  Alert and oriented x 3, normal gait, CN II-XII grossly intact.  MENTAL STATUS:  Appropriate. BACK:  Non-tender to palpation.  No CVAT SKIN:  Warm, dry and intact.   GU: Penis:  circumcised Meatus: Normal Scrotum: normal, no masses Testis: normal without masses bilateral Epididymis: normal Prostate: 60 gm, NT, no nodules Rectum: Normal tone,  no masses or tenderness   Results: U/A: 0-5 WBCs, 0-2 RBCs  PVR: 288 mL

## 2022-05-16 NOTE — Progress Notes (Signed)
post void residual =232m

## 2022-05-18 LAB — PSA: Prostate Specific Ag, Serum: 5.3 ng/mL — ABNORMAL HIGH (ref 0.0–4.0)

## 2022-05-26 ENCOUNTER — Ambulatory Visit (HOSPITAL_COMMUNITY): Payer: BC Managed Care – PPO

## 2022-05-27 ENCOUNTER — Ambulatory Visit (HOSPITAL_COMMUNITY)
Admission: RE | Admit: 2022-05-27 | Discharge: 2022-05-27 | Disposition: A | Discharge status: Home / Self Care | Payer: BC Managed Care – PPO | Source: Ambulatory Visit | Attending: Family Medicine | Admitting: Family Medicine

## 2022-05-27 DIAGNOSIS — N3001 Acute cystitis with hematuria: Secondary | ICD-10-CM | POA: Diagnosis not present

## 2022-05-27 DIAGNOSIS — R319 Hematuria, unspecified: Secondary | ICD-10-CM | POA: Diagnosis not present

## 2022-05-27 NOTE — Progress Notes (Signed)
Please inform the patient that the recent ultrasound of his kidneys showed no kidney stones or abnormalities of the kidneys.

## 2022-06-02 ENCOUNTER — Encounter: Payer: Self-pay | Admitting: Family Medicine

## 2022-06-02 ENCOUNTER — Ambulatory Visit: Payer: BC Managed Care – PPO | Admitting: Family Medicine

## 2022-06-02 VITALS — BP 133/80 | HR 89 | Ht 67.0 in | Wt 188.0 lb

## 2022-06-02 DIAGNOSIS — Z23 Encounter for immunization: Secondary | ICD-10-CM | POA: Diagnosis not present

## 2022-06-02 DIAGNOSIS — M5441 Lumbago with sciatica, right side: Secondary | ICD-10-CM | POA: Diagnosis not present

## 2022-06-02 DIAGNOSIS — E119 Type 2 diabetes mellitus without complications: Secondary | ICD-10-CM | POA: Diagnosis not present

## 2022-06-02 DIAGNOSIS — M5442 Lumbago with sciatica, left side: Secondary | ICD-10-CM

## 2022-06-02 DIAGNOSIS — I10 Essential (primary) hypertension: Secondary | ICD-10-CM

## 2022-06-02 DIAGNOSIS — G8929 Other chronic pain: Secondary | ICD-10-CM | POA: Diagnosis not present

## 2022-06-02 DIAGNOSIS — M5440 Lumbago with sciatica, unspecified side: Secondary | ICD-10-CM

## 2022-06-02 MED ORDER — CYCLOBENZAPRINE HCL 5 MG PO TABS: 5.0000 mg | ORAL_TABLET | Freq: Three times a day (TID) | ORAL | 1 refills | Status: DC | PRN

## 2022-06-02 NOTE — Assessment & Plan Note (Signed)
Recommended use of back brace to help support her back with prolonged sitting The patient's kidney function is stable Recommended use of otc Tylenol or ibuprofen as needed Will start patient on Flexeril 5 mg 3 times daily as needed Encourage patient only to take the medication at bedtime as one of the side effects of the medication is drowsiness Encourage patient to continue taking gabapentin 100 mg nightly for nerve pain

## 2022-06-02 NOTE — Assessment & Plan Note (Signed)
Controlled Encouraged to continue taking lisinopril 10 mg daily He denies headaches, dizziness, blurred vision, chest pain, palpitations, and shortness of breath

## 2022-06-02 NOTE — Assessment & Plan Note (Signed)
Patient educated on CDC recommendation for the vaccine. Verbal consent was obtained from the patient, vaccine administered by nurse, no sign of adverse reactions noted at this time. Patient education on arm soreness and use of tylenol or ibuprofen for this patient  was discussed. Patient educated on the signs and symptoms of adverse effect and advise to contact the office if they occur.  

## 2022-06-02 NOTE — Assessment & Plan Note (Signed)
Will follow up on the referral  placed to endocrinologist to ensure the patient follows up Scott Perez was denied by the patient's insurance, will do an appeal so that the patient can be started on mounjaro If the appeal is denied I will reinstate Victoza He denies the 3ps of diabetes Encouraged to continue taking metformin 1000 mg twice daily and Farxiga 5 mg daily

## 2022-06-02 NOTE — Patient Instructions (Addendum)
I appreciate the opportunity to provide care to you today!    Follow up:  3 months     Please pick up your medications at the pharmacy   Please continue to a heart-healthy diet and increase your physical activities. Try to exercise for 71mns at least three times a week.      It was a pleasure to see you and I look forward to continuing to work together on your health and well-being. Please do not hesitate to call the office if you need care or have questions about your care.   Have a wonderful day and week. With Gratitude, GAlvira MondayMSN, FNP-BC

## 2022-06-02 NOTE — Progress Notes (Signed)
Established Patient Office Visit  Subjective:  [name], male    DOB: 1961/04/04  Age: 61 y.o. MRN: [id number]  CC:  Chief Complaint  Patient presents with   Follow-up   Back Pain    [name] still having back  pain been going on since last month September 2023 and both legs and arms are sore .Marland Kitchen    HPI Scott Perez is a 61 y.o. male with past medical history of type 2 diabetes, hypertension, BPH presents for f/u of  chronic medical conditions.  T2DM: He denies polyuria, polydipsia and polyphagia.  He has not started Mounjaro 2.5 mg weekly injection. He takes metformin 1000 mg twice daily, and farxiga 5 mg daily.  He has not yet followed up with the endocrinologist.  HTN: He takes lisinopril 10 mg daily.  He denies headaches, dizziness, blurred vision, chest pain, palpitations and shortness of breath.  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  He complains of right lower lumbar back pain with sciatica bilaterally.  He takes gabapentin 100 mg at bedtime.  He reports increased pain with prolonged sitting and complains of stiffness when he gets up.   Past Medical History:  Diagnosis Date   BPH (benign prostatic hypertrophy)    Diabetes mellitus    H. pylori infection 12/2011   treated with prevpac   History of kidney stones    Hyperlipidemia    Hypertension    Sleep apnea     Past Surgical History:  Procedure Laterality Date   COLONOSCOPY N/A 08/06/2018   Procedure: COLONOSCOPY;  Surgeon: Daneil Dolin, MD;  [facility];  Service: Endoscopy;  Laterality: N/A;  8:30   cyst left wrist     left foot  2004    Family History  Problem Relation Age of Onset   Breast cancer Mother    Hypertension Father    Colon polyps Brother    Colon cancer Brother    Colon cancer Paternal Uncle     Social History   Socioeconomic History   Marital status: Married    Spouse name: Not on file   Number of  children: 3   Years of education: Not on file   Highest education level: Not on file  Occupational History   Occupation: truck driver  Tobacco Use   Smoking status: Former    Packs/day: 1.00    Years: 18.00    Total pack years: 18.00    Types: Cigarettes    Quit date: 12/19/1995    Years since quitting: 26.4   Smokeless tobacco: Never  Vaping Use   Vaping Use: Never used  Substance and Sexual Activity   Alcohol use: No   Drug use: No   Sexual activity: Yes  Other Topics Concern   Not on file  Social History Narrative   Not on file   Social Determinants of Health   Financial Resource Strain: Not on file  Food Insecurity: Not on file  Transportation Needs: Not on file  Physical Activity: Not on file  Stress: Not on file  Social Connections: Not on file  Intimate Partner Violence: Not on file    Outpatient Medications Prior to Visit  Medication Sig Dispense Refill   aspirin 81 MG tablet Take 81 mg by mouth 3 (three) times a week.     dapagliflozin propanediol (FARXIGA) 5 MG TABS tablet Take 1 tablet (5 mg  total) by mouth daily before breakfast. 30 tablet 1   gabapentin (NEURONTIN) 100 MG capsule Take 1 capsule (100 mg total) by mouth at bedtime. 90 capsule 3   lisinopril (ZESTRIL) 10 MG tablet Take 1 tablet (10 mg total) by mouth daily. 90 tablet 3   metFORMIN (GLUCOPHAGE) 1000 MG tablet Take 1 tablet (1,000 mg total) by mouth 2 (two) times daily with a meal. 180 tablet 1   rosuvastatin (CRESTOR) 10 MG tablet Take 1 tablet (10 mg total) by mouth daily. 90 tablet 3   sildenafil (VIAGRA) 100 MG tablet Take 1 tablet (100 mg total) by mouth as needed for erectile dysfunction. 6 tablet 0   tamsulosin (FLOMAX) 0.4 MG CAPS capsule Take 1 capsule (0.4 mg total) by mouth daily. 90 capsule 0   tirzepatide (MOUNJARO) 2.5 MG/0.5ML Pen Inject 2.5 mg into the skin once a week. 2 mL 0   Vitamin D, Ergocalciferol, (DRISDOL) 1.25 MG (50000 UNIT) CAPS capsule Take 1 capsule (50,000 Units  total) by mouth every 7 (seven) days. 5 capsule 2   pioglitazone (ACTOS) 45 MG tablet Take 1 tablet (45 mg total) by mouth every morning. 30 tablet 5   No facility-administered medications prior to visit.    No Known Allergies  ROS Review of Systems  Constitutional:  Negative for fatigue and fever.  Eyes:  Negative for visual disturbance.  Respiratory:  Negative for chest tightness and shortness of breath.   Cardiovascular:  Negative for chest pain and palpitations.  Gastrointestinal:  Negative for diarrhea, nausea and vomiting.  Endocrine: Negative for polydipsia, polyphagia and polyuria.  Musculoskeletal:  Positive for back pain.  Neurological:  Negative for dizziness and headaches.      Objective:    Physical Exam HENT:     Head: Normocephalic.  Eyes:     Extraocular Movements: Extraocular movements intact.     Pupils: Pupils are equal, round, and reactive to light.  Cardiovascular:     Rate and Rhythm: Normal rate and regular rhythm.  Pulmonary:     Effort: Pulmonary effort is normal.     Breath sounds: Normal breath sounds.  Abdominal:     Tenderness: There is no right CVA tenderness or left CVA tenderness.  Musculoskeletal:     Lumbar back: Tenderness present.  Neurological:     Mental Status: He is alert.     BP 133/80 (BP Location: Right Arm, Patient Position: Sitting)   Pulse 89   Ht '5\' 7"'  (1.702 m)   Wt 188 lb (85.3 kg)   SpO2 97%   BMI 29.44 kg/m  Wt Readings from Last 3 Encounters:  06/02/22 188 lb (85.3 kg)  05/16/22 190 lb (86.2 kg)  05/12/22 189 lb 1.9 oz (85.8 kg)    Lab Results  Component Value Date   TSH 3.990 05/12/2022   Lab Results  Component Value Date   WBC 5.1 05/12/2022   HGB 16.2 05/12/2022   HCT 48.6 05/12/2022   MCV 88 05/12/2022   PLT 214 05/12/2022   Lab Results  Component Value Date   NA 130 (L) 05/12/2022   K 4.3 05/12/2022   CO2 25 05/12/2022   GLUCOSE 454 (H) 05/12/2022   BUN 13 05/12/2022   CREATININE 1.17  05/12/2022   BILITOT 0.4 05/12/2022   ALKPHOS 118 05/12/2022   AST 13 05/12/2022   ALT 15 05/12/2022   PROT 6.5 05/12/2022   ALBUMIN 4.0 05/12/2022   CALCIUM 10.6 (H) 05/12/2022   EGFR 71 05/12/2022  Lab Results  Component Value Date   CHOL 208 (H) 05/12/2022   Lab Results  Component Value Date   HDL 47 05/12/2022   Lab Results  Component Value Date   LDLCALC 131 (H) 05/12/2022   Lab Results  Component Value Date   TRIG 166 (H) 05/12/2022   Lab Results  Component Value Date   CHOLHDL 4.4 05/12/2022   Lab Results  Component Value Date   HGBA1C >15.5 (H) 05/12/2022      Assessment & Plan:   Problem List Items Addressed This Visit       Cardiovascular and Mediastinum   Essential hypertension, benign    Controlled Encouraged to continue taking lisinopril 10 mg daily He denies headaches, dizziness, blurred vision, chest pain, palpitations, and shortness of breath        Endocrine   Type 2 diabetes mellitus without complications ([place]) - Primary    Will follow up on the referral  placed to endocrinologist to ensure the patient follows up Darcel Bayley was denied by the patient's insurance, will do an appeal so that the patient can be started on mounjaro If the appeal is denied I will reinstate Victoza He denies the 3ps of diabetes Encouraged to continue taking metformin 1000 mg twice daily and Farxiga 5 mg daily         Nervous and Auditory   Chronic right-sided low back pain with bilateral sciatica    Recommended use of back brace to help support her back with prolonged sitting The patient's kidney function is stable Recommended use of otc Tylenol or ibuprofen as needed Will start patient on Flexeril 5 mg 3 times daily as needed Encourage patient only to take the medication at bedtime as one of the side effects of the medication is drowsiness Encourage patient to continue taking gabapentin 100 mg nightly for nerve pain      Relevant Medications    cyclobenzaprine (FLEXERIL) 5 MG tablet     Other   Need for immunization against influenza    Patient educated on CDC recommendation for the vaccine. Verbal consent was obtained from the patient, vaccine administered by nurse, no sign of adverse reactions noted at this time. Patient education on arm soreness and use of tylenol or ibuprofen for this patient  was discussed. Patient educated on the signs and symptoms of adverse effect and advise to contact the office if they occur.      Relevant Orders   Flu Vaccine QUAD 55moIM (Fluarix, Fluzone & Alfiuria Quad PF) (Completed)   Other Visit Diagnoses     Chronic right-sided low back pain with sciatica, sciatica laterality unspecified       Relevant Medications   cyclobenzaprine (FLEXERIL) 5 MG tablet       Meds ordered this encounter  Medications   cyclobenzaprine (FLEXERIL) 5 MG tablet    Sig: Take 1 tablet (5 mg total) by mouth 3 (three) times daily as needed for muscle spasms.    Dispense:  30 tablet    Refill:  1    Follow-up: Return in about 3 months (around 09/02/2022).    GAlvira Monday FNP

## 2022-06-03 ENCOUNTER — Other Ambulatory Visit: Payer: Self-pay

## 2022-06-03 ENCOUNTER — Telehealth: Payer: Self-pay

## 2022-06-03 DIAGNOSIS — E559 Vitamin D deficiency, unspecified: Secondary | ICD-10-CM

## 2022-06-03 MED ORDER — VITAMIN D (ERGOCALCIFEROL) 1.25 MG (50000 UNIT) PO CAPS: 50000.0000 [IU] | ORAL_CAPSULE | ORAL | 2 refills | Status: AC

## 2022-06-03 NOTE — Telephone Encounter (Signed)
Need med refill  Vitamin D, Ergocalciferol, (DRISDOL) 1.25 MG (50000 UNIT) CAPS capsule [[id number]]    [facility]: [facility]

## 2022-06-03 NOTE — Telephone Encounter (Signed)
Vm left informing pt of refill.

## 2022-06-20 ENCOUNTER — Encounter: Payer: Self-pay | Admitting: Urology

## 2022-06-20 ENCOUNTER — Encounter: Payer: Self-pay | Admitting: Family Medicine

## 2022-06-20 ENCOUNTER — Ambulatory Visit: Payer: BC Managed Care – PPO | Admitting: Urology

## 2022-06-20 VITALS — BP 147/84 | HR 89 | Ht 67.0 in | Wt 180.0 lb

## 2022-06-20 DIAGNOSIS — C61 Malignant neoplasm of prostate: Secondary | ICD-10-CM | POA: Diagnosis not present

## 2022-06-20 DIAGNOSIS — R310 Gross hematuria: Secondary | ICD-10-CM | POA: Diagnosis not present

## 2022-06-20 DIAGNOSIS — N401 Enlarged prostate with lower urinary tract symptoms: Secondary | ICD-10-CM | POA: Diagnosis not present

## 2022-06-20 DIAGNOSIS — N138 Other obstructive and reflux uropathy: Secondary | ICD-10-CM | POA: Diagnosis not present

## 2022-06-20 LAB — URINALYSIS, ROUTINE W REFLEX MICROSCOPIC
Bilirubin, UA: NEGATIVE
Ketones, UA: NEGATIVE
Leukocytes,UA: NEGATIVE
Nitrite, UA: NEGATIVE
RBC, UA: NEGATIVE
Specific Gravity, UA: 1.015 (ref 1.005–1.030)
Urobilinogen, Ur: 2 mg/dL — ABNORMAL HIGH (ref 0.2–1.0)
pH, UA: 6.5 (ref 5.0–7.5)

## 2022-06-20 LAB — HM DIABETES EYE EXAM

## 2022-06-20 MED ORDER — ALFUZOSIN HCL ER 10 MG PO TB24: 10.0000 mg | ORAL_TABLET | Freq: Every day | ORAL | 11 refills | Status: DC

## 2022-06-20 MED ORDER — CIPROFLOXACIN HCL 500 MG PO TABS
500.0000 mg | ORAL_TABLET | Freq: Once | ORAL | Status: AC
  Administered 2022-06-20: 500 mg via ORAL

## 2022-06-20 NOTE — Progress Notes (Signed)
documentation

## 2022-06-20 NOTE — Progress Notes (Signed)
Assessment: 1. Gross hematuria   2. BPH with obstruction/lower urinary tract symptoms   3. Prostate cancer ([place]); low risk disease; biopsy 10/20; on active surveillance     Plan: I reviewed the renal ultrasound results from 05/27/2022 as noted below. I discussed the findings on cystoscopy with the patient today. Urine sent for cytology. Cipro x1 following cystoscopy. Schedule for CT hematuria protocol for continued evaluation of his gross hematuria. Trial of alfuzosin 10 mg daily in place of tamsulosin. I discussed the recommendations for repeat prostate biopsy for patients on active surveillance for prostate cancer.  Will make arrangements after his next visit. Return to office in 1 month  Chief Complaint:  Chief Complaint  Patient presents with   Cysto    History of Present Illness:  Scott Perez is a 61 y.o. male who is seen for further evaluation of gross hematuria, lower urinary tract symptoms, and prostate cancer.  He has a recent history of gross hematuria for approximately 1 month.  At his initial visit in 9/23, her reported his last episode of gross hematuria was approximately 1 month prior.  He reported urinary frequency, urgency, nocturia, intermittent stream, decreased force of stream, and hesitancy. No dysuria or flank pain. Urine culture from 05/12/2022 showed no growth. He has a remote history of UTI. He was previously on tamsulosin but discontinued.  IPSS = 15.  PVR = 288 ml.  He was previously seen by Dr. Diona Fanti and diagnosed with prostate cancer.  Prostate biopsy from October 2020 was done for a PSA of 4.9.  Prostate volume measured 96 mL.  1 out of 12 cores showed Gleason 3+3 adenocarcinoma.  He was being managed with active surveillance.  He had not returned for follow-up since January 2021. PSA from 1/21 was 3.4. PSA from 9/23 was 5.3.  He returns today for further evaluation with cystoscopy.  He has not scheduled the CT study to date.  He was evaluated  with a renal ultrasound on 05/27/2022 which showed no renal masses or hydronephrosis and a prostate gland measuring 93.9 cm. He has not had any further episodes of gross hematuria.  No dysuria or flank pain.  He continues with urinary frequency, intermittent stream, sensation of incomplete emptying, and decreased force of stream.  He has resumed tamsulosin but has not seen an improvement in his symptoms. IPSS = 25 today.  Portions of the above documentation were copied from a prior visit for review purposes only.   Past Medical History:  Past Medical History:  Diagnosis Date   BPH (benign prostatic hypertrophy)    Diabetes mellitus    H. pylori infection 12/2011   treated with prevpac   History of kidney stones    Hyperlipidemia    Hypertension    Sleep apnea     Past Surgical History:  Past Surgical History:  Procedure Laterality Date   COLONOSCOPY N/A 08/06/2018   Procedure: COLONOSCOPY;  Surgeon: Daneil Dolin, MD;  [facility];  Service: Endoscopy;  Laterality: N/A;  8:30   cyst left wrist     left foot  2004    Allergies:  No Known Allergies  Family History:  Family History  Problem Relation Age of Onset   Breast cancer Mother    Hypertension Father    Colon polyps Brother    Colon cancer Brother    Colon cancer Paternal Uncle     Social History:  Social History   Tobacco Use   Smoking status: Former  Packs/day: 1.00    Years: 18.00    Total pack years: 18.00    Types: Cigarettes    Quit date: 12/19/1995    Years since quitting: 26.5   Smokeless tobacco: Never  Vaping Use   Vaping Use: Never used  Substance Use Topics   Alcohol use: No   Drug use: No    ROS: Constitutional:  Negative for fever, chills, weight loss CV: Negative for chest pain, previous MI, hypertension Respiratory:  Negative for shortness of breath, wheezing, sleep apnea, frequent cough GI:  Negative for nausea, vomiting, bloody stool, GERD  Physical exam: BP (!)  147/84   Pulse 89   Ht '5\' 7"'$  (1.702 m)   Wt 180 lb (81.6 kg)   BMI 28.19 kg/m  GENERAL APPEARANCE:  Well appearing, well developed, well nourished, NAD HEENT:  Atraumatic, normocephalic, oropharynx clear NECK:  Supple without lymphadenopathy or thyromegaly ABDOMEN:  Soft, non-tender, no masses EXTREMITIES:  Moves all extremities well, without clubbing, cyanosis, or edema NEUROLOGIC:  Alert and oriented x 3, normal gait, CN II-XII grossly intact MENTAL STATUS:  appropriate BACK:  Non-tender to palpation, No CVAT SKIN:  Warm, dry, and intact   Results: U/A: dipstick negative  Procedure:  Flexible Cystourethroscopy  Pre-operative Diagnosis: Gross hematuria  Post-operative Diagnosis: Gross hematuria  Anesthesia:  local with lidocaine jelly  Surgical Narrative:  After appropriate informed consent was obtained, the patient was prepped and draped in the usual sterile fashion in the supine position.  The patient was correctly identified and the proper procedure delineated prior to proceeding.  Sterile lidocaine gel was instilled in the urethra. The flexible cystoscope was introduced without difficulty.  Findings:  Anterior urethra: Normal  Posterior urethra: Lateral lobe hypertrophy, no median lobe  Bladder: no mucosal lesions seen, no trabeculations or cellules  Ureteral orifices: normal  Additional findings:  Saline bladder wash for cytology was performed.    The cystoscope was then removed.  The patient tolerated the procedure well.

## 2022-06-26 ENCOUNTER — Encounter: Payer: Self-pay | Admitting: Urology

## 2022-07-25 ENCOUNTER — Ambulatory Visit: Payer: BC Managed Care – PPO | Admitting: Urology

## 2022-07-25 NOTE — Progress Notes (Deleted)
Assessment: 1. Gross hematuria; negative evaluation 10/23   2. BPH with obstruction/lower urinary tract symptoms   3. Prostate cancer ([place]); low risk disease; biopsy 10/20; on active surveillance     Plan: Schedule for CT hematuria protocol for continued evaluation of his gross hematuria. Trial of alfuzosin 10 mg daily in place of tamsulosin. I discussed the recommendations for repeat prostate biopsy for patients on active surveillance for prostate cancer.  Will make arrangements after his next visit. Return to office in 1 month  Chief Complaint:  No chief complaint on file.   History of Present Illness:  Scott Perez is a 61 y.o. male who is seen for further evaluation of gross hematuria, lower urinary tract symptoms, and prostate cancer.  He has a recent history of gross hematuria for approximately 1 month.  At his initial visit in 9/23, her reported his last episode of gross hematuria was approximately 1 month prior.  He reported urinary frequency, urgency, nocturia, intermittent stream, decreased force of stream, and hesitancy. No dysuria or flank pain. Urine culture from 05/12/2022 showed no growth. He has a remote history of UTI. He was previously on tamsulosin but discontinued.  IPSS = 15.  PVR = 288 ml.  He was previously seen by Dr. Diona Fanti and diagnosed with prostate cancer.  Prostate biopsy from October 2020 was done for a PSA of 4.9.  Prostate volume measured 96 mL.  1 out of 12 cores showed Gleason 3+3 adenocarcinoma.  He was being managed with active surveillance.  He had not returned for follow-up since January 2021. PSA from 1/21 was 3.4. PSA from 9/23 was 5.3.  He was evaluated with a renal ultrasound on 05/27/2022 which showed no renal masses or hydronephrosis and a prostate gland measuring 93.9 cm. He had not had any further episodes of gross hematuria.  No dysuria or flank pain.  He continued with urinary frequency, intermittent stream, sensation of  incomplete emptying, and decreased force of stream.  He resumed tamsulosin but had not seen any improvement in his symptoms. IPSS = 25. Cystoscopy from 06/20/2022 showed lateral lobe enlargement of the prostate without a median lobe and no bladder abnormalities. Urine cytology was negative for malignancy. He was given a trial of alfuzosin in place of the tamsulosin.  Portions of the above documentation were copied from a prior visit for review purposes only.   Past Medical History:  Past Medical History:  Diagnosis Date   BPH (benign prostatic hypertrophy)    Diabetes mellitus    H. pylori infection 12/2011   treated with prevpac   History of kidney stones    Hyperlipidemia    Hypertension    Sleep apnea     Past Surgical History:  Past Surgical History:  Procedure Laterality Date   COLONOSCOPY N/A 08/06/2018   Procedure: COLONOSCOPY;  Surgeon: Daneil Dolin, MD;  Location: [facility];  Service: Endoscopy;  Laterality: N/A;  8:30   cyst left wrist     left foot  2004    Allergies:  No Known Allergies  Family History:  Family History  Problem Relation Age of Onset   Breast cancer Mother    Hypertension Father    Colon polyps Brother    Colon cancer Brother    Colon cancer Paternal Uncle     Social History:  Social History   Tobacco Use   Smoking status: Former    Packs/day: 1.00    Years: 18.00    Total pack years:  18.00    Types: Cigarettes    Quit date: 12/19/1995    Years since quitting: 26.6   Smokeless tobacco: Never  Vaping Use   Vaping Use: Never used  Substance Use Topics   Alcohol use: No   Drug use: No    ROS: Constitutional:  Negative for fever, chills, weight loss CV: Negative for chest pain, previous MI, hypertension Respiratory:  Negative for shortness of breath, wheezing, sleep apnea, frequent cough GI:  Negative for nausea, vomiting, bloody stool, GERD  Physical exam: There were no vitals taken for this visit. GENERAL  APPEARANCE:  Well appearing, well developed, well nourished, NAD HEENT:  Atraumatic, normocephalic, oropharynx clear NECK:  Supple without lymphadenopathy or thyromegaly ABDOMEN:  Soft, non-tender, no masses EXTREMITIES:  Moves all extremities well, without clubbing, cyanosis, or edema NEUROLOGIC:  Alert and oriented x 3, normal gait, CN II-XII grossly intact MENTAL STATUS:  appropriate BACK:  Non-tender to palpation, No CVAT SKIN:  Warm, dry, and intact  Results: U/A:

## 2022-08-04 ENCOUNTER — Ambulatory Visit: Payer: BC Managed Care – PPO | Admitting: "Endocrinology

## 2022-08-11 DIAGNOSIS — H3540 Unspecified peripheral retinal degeneration: Secondary | ICD-10-CM | POA: Diagnosis not present

## 2022-08-22 ENCOUNTER — Other Ambulatory Visit: Payer: Self-pay | Admitting: Family Medicine

## 2022-08-22 DIAGNOSIS — E1165 Type 2 diabetes mellitus with hyperglycemia: Secondary | ICD-10-CM

## 2022-09-03 ENCOUNTER — Ambulatory Visit: Payer: BC Managed Care – PPO | Admitting: Family Medicine

## 2022-09-03 ENCOUNTER — Encounter: Payer: Self-pay | Admitting: Family Medicine

## 2022-10-10 ENCOUNTER — Other Ambulatory Visit: Payer: Self-pay | Admitting: Family Medicine

## 2022-10-10 DIAGNOSIS — E1165 Type 2 diabetes mellitus with hyperglycemia: Secondary | ICD-10-CM

## 2022-10-13 ENCOUNTER — Other Ambulatory Visit: Payer: Self-pay | Admitting: Family Medicine

## 2022-10-13 DIAGNOSIS — E119 Type 2 diabetes mellitus without complications: Secondary | ICD-10-CM

## 2022-10-13 MED ORDER — TIRZEPATIDE 5 MG/0.5ML ~~LOC~~ SOAJ: 5.0000 mg | SUBCUTANEOUS | 0 refills | Status: DC

## 2022-10-13 NOTE — Telephone Encounter (Signed)
Rx sent 

## 2023-02-24 DIAGNOSIS — H35033 Hypertensive retinopathy, bilateral: Secondary | ICD-10-CM | POA: Diagnosis not present

## 2023-08-11 ENCOUNTER — Encounter (INDEPENDENT_AMBULATORY_CARE_PROVIDER_SITE_OTHER): Payer: Self-pay | Admitting: *Deleted

## 2023-08-13 ENCOUNTER — Telehealth: Payer: Self-pay

## 2023-08-13 NOTE — Transitions of Care (Post Inpatient/ED Visit) (Unsigned)
   08/13/2023  Name: Scott Perez MRN: [id number] DOB: 1960-11-26  Today's TOC FU Call Status: Today's TOC FU Call Status:: Unsuccessful Call (1st Attempt) Unsuccessful Call (1st Attempt) Date: 08/13/23  Attempted to reach the patient regarding the most recent Inpatient/ED visit.  Follow Up Plan: Additional outreach attempts will be made to reach the patient to complete the Transitions of Care (Post Inpatient/ED visit) call.    Nathaniel Yaden, CMA  [facility] Team Direct Dial: [phone]

## 2023-08-18 DIAGNOSIS — R112 Nausea with vomiting, unspecified: Secondary | ICD-10-CM | POA: Diagnosis not present

## 2023-08-18 DIAGNOSIS — I10 Essential (primary) hypertension: Secondary | ICD-10-CM | POA: Diagnosis not present

## 2023-08-18 DIAGNOSIS — R739 Hyperglycemia, unspecified: Secondary | ICD-10-CM | POA: Diagnosis not present

## 2023-08-18 DIAGNOSIS — Z79891 Long term (current) use of opiate analgesic: Secondary | ICD-10-CM | POA: Diagnosis not present

## 2023-08-18 DIAGNOSIS — S060X0A Concussion without loss of consciousness, initial encounter: Secondary | ICD-10-CM | POA: Diagnosis not present

## 2023-08-18 DIAGNOSIS — S0990XD Unspecified injury of head, subsequent encounter: Secondary | ICD-10-CM | POA: Diagnosis not present

## 2023-08-18 DIAGNOSIS — S0990XA Unspecified injury of head, initial encounter: Secondary | ICD-10-CM | POA: Diagnosis not present

## 2023-08-18 DIAGNOSIS — R1110 Vomiting, unspecified: Secondary | ICD-10-CM | POA: Diagnosis not present

## 2023-08-18 DIAGNOSIS — R519 Headache, unspecified: Secondary | ICD-10-CM | POA: Diagnosis not present

## 2023-08-18 DIAGNOSIS — G43909 Migraine, unspecified, not intractable, without status migrainosus: Secondary | ICD-10-CM | POA: Diagnosis not present

## 2023-08-18 DIAGNOSIS — W208XXA Other cause of strike by thrown, projected or falling object, initial encounter: Secondary | ICD-10-CM | POA: Diagnosis not present

## 2023-08-20 NOTE — Transitions of Care (Post Inpatient/ED Visit) (Signed)
   08/20/2023  Name: JT HAEFS MRN: [id number] DOB: 06-06-1961  Today's TOC FU Call Status: Today's TOC FU Call Status:: Unsuccessful Call (2nd Attempt) Unsuccessful Call (1st Attempt) Date: 08/13/23 Unsuccessful Call (2nd Attempt) Date: 08/20/23  Attempted to reach the patient regarding the most recent Inpatient/ED visit.  Follow Up Plan: Additional outreach attempts will be made to reach the patient to complete the Transitions of Care (Post Inpatient/ED visit) call.    Arayna Illescas, CMA  [facility] Team Direct Dial: [phone]

## 2023-08-21 ENCOUNTER — Ambulatory Visit: Payer: BC Managed Care – PPO | Admitting: Internal Medicine

## 2023-08-21 ENCOUNTER — Encounter: Payer: Self-pay | Admitting: Internal Medicine

## 2023-08-21 VITALS — BP 127/80 | HR 97 | Ht 68.0 in | Wt 185.8 lb

## 2023-08-21 DIAGNOSIS — S060X0S Concussion without loss of consciousness, sequela: Secondary | ICD-10-CM

## 2023-08-21 DIAGNOSIS — E348 Other specified endocrine disorders: Secondary | ICD-10-CM | POA: Diagnosis not present

## 2023-08-21 DIAGNOSIS — S060X0D Concussion without loss of consciousness, subsequent encounter: Secondary | ICD-10-CM | POA: Diagnosis not present

## 2023-08-21 DIAGNOSIS — I10 Essential (primary) hypertension: Secondary | ICD-10-CM

## 2023-08-21 DIAGNOSIS — S060X0A Concussion without loss of consciousness, initial encounter: Secondary | ICD-10-CM | POA: Insufficient documentation

## 2023-08-21 NOTE — Progress Notes (Signed)
Acute Office Visit  Subjective:     Patient ID: DEMERE CAPEL, male    DOB: 1961-01-27, 62 y.o.   MRN: [id number]  Chief Complaint  Patient presents with   Follow-up    ER follow up    Scott Perez presents today for an acute visit in the setting of a recent head injury.  He presented to the emergency department in [place], [place] on 12/18 endorsing a head injury.  He reports dropping a heavy chain on his head while raising a garage door.  He denied loss of consciousness.  Imaging obtained in the emergency department was negative for acute intracranial findings, but was significant for incidentally noted pineal gland cyst.  He was referred to neurosurgery and discharged home.  He then presented to the emergency department on 12/24 endorsing a headache with nausea and vomiting.  Repeat imaging was negative for acute intracranial findings.  Headache resolved with treatment in the emergency department.  His blood pressure was elevated.  Hydralazine was refilled.  He was told to follow-up with his PCP. Today Scott Perez states that symptoms of headache, dizziness, and grogginess have improved.  He returned to work last night, but states that he needs documentation from a medical provider stating that he is cleared to return to work.  He does not have any additional concerns to discuss today.  Review of Systems  Constitutional:  Negative for chills and fever.  HENT:  Negative for sore throat.   Respiratory:  Negative for cough and shortness of breath.   Cardiovascular:  Negative for chest pain, palpitations and leg swelling.  Gastrointestinal:  Negative for abdominal pain, blood in stool, constipation, diarrhea, nausea and vomiting.  Genitourinary:  Negative for dysuria and hematuria.  Musculoskeletal:  Negative for myalgias.  Skin:  Negative for itching and rash.  Neurological:  Negative for dizziness and headaches.  Psychiatric/Behavioral:  Negative for depression and suicidal ideas.        Objective:    BP 127/80 (BP Location: Left Arm, Patient Position: Sitting, Cuff Size: Normal)   Pulse 97   Ht 5\' 8"  (1.727 m)   Wt 185 lb 12.8 oz (84.3 kg)   SpO2 98%   BMI 28.25 kg/m   Physical Exam Vitals reviewed.  Constitutional:      General: He is not in acute distress.    Appearance: Normal appearance. He is not ill-appearing.  HENT:     Head: Normocephalic and atraumatic.     Right Ear: External ear normal.     Left Ear: External ear normal.     Nose: Nose normal. No congestion or rhinorrhea.     Mouth/Throat:     Mouth: Mucous membranes are moist.     Pharynx: Oropharynx is clear.  Eyes:     General: No scleral icterus.    Extraocular Movements: Extraocular movements intact.     Conjunctiva/sclera: Conjunctivae normal.     Pupils: Pupils are equal, round, and reactive to light.  Cardiovascular:     Rate and Rhythm: Normal rate and regular rhythm.     Pulses: Normal pulses.     Heart sounds: Normal heart sounds. No murmur heard. Pulmonary:     Effort: Pulmonary effort is normal.     Breath sounds: Normal breath sounds. No wheezing, rhonchi or rales.  Abdominal:     General: Abdomen is flat. Bowel sounds are normal. There is no distension.     Palpations: Abdomen is soft.     Tenderness:  There is no abdominal tenderness.  Musculoskeletal:        General: No swelling or deformity. Normal range of motion.     Cervical back: Normal range of motion.  Skin:    General: Skin is warm and dry.     Capillary Refill: Capillary refill takes less than 2 seconds.  Neurological:     General: No focal deficit present.     Mental Status: He is alert and oriented to person, place, and time.     Motor: No weakness.  Psychiatric:        Mood and Affect: Mood normal.        Behavior: Behavior normal.        Thought Content: Thought content normal.     No results found for any visits on 08/21/23.      Assessment & Plan:   Problem List Items Addressed This Visit        Essential hypertension, benign   BP in office today is 127/80.  He is currently taking hydralazine 25 mg 3 times daily provided by the emergency department in [place], [place].  Per our records, he is prescribed lisinopril 10 mg daily but states that he has not been taking it recently. -No medication changes were made today.  Needs close follow-up with PCP for routine care. Will request an appointment for 2 weeks.      Pineal gland cyst   9 mm calcified pineal gland cyst incidentally noted on recent imaging as part of ER workup in the setting of a head injury.  He was referred to neurosurgery and has an appointment scheduled for 1/20.      Concussion without loss of consciousness - Primary   Presenting today for an acute visit in the setting of a recent head injury.  He has multiple ER presentations, 12/24 most recently, after dropping a heavy chain on his head while raising a garage door at work.  On 12/24 he endorsed headache with nausea/vomiting and grogginess.  Today he states that symptoms have resolved.  He returned to work last night and states that he occasionally experiences dizziness when bending over.  Neuroexam today is unremarkable. -No further workup indicated at this time.  He was provided with a return to work note today recommending light duty until he is able to follow-up with his PCP.  I specifically recommended that he not operate heavy machinery and not lift any objects > 25 lbs until cleared by his PCP at his next follow-up appointment.      Return in about 2 weeks (around 09/04/2023).  Billie Lade, MD

## 2023-08-21 NOTE — Assessment & Plan Note (Signed)
Presenting today for an acute visit in the setting of a recent head injury.  He has multiple ER presentations, 12/24 most recently, after dropping a heavy chain on his head while raising a garage door at work.  On 12/24 he endorsed headache with nausea/vomiting and grogginess.  Today he states that symptoms have resolved.  He returned to work last night and states that he occasionally experiences dizziness when bending over.  Neuroexam today is unremarkable. -No further workup indicated at this time.  He was provided with a return to work note today recommending light duty until he is able to follow-up with his PCP.  I specifically recommended that he not operate heavy machinery and not lift any objects > 25 lbs until cleared by his PCP at his next follow-up appointment.

## 2023-08-21 NOTE — Assessment & Plan Note (Signed)
BP in office today is 127/80.  He is currently taking hydralazine 25 mg 3 times daily provided by the emergency department in [place], [place].  Per our records, he is prescribed lisinopril 10 mg daily but states that he has not been taking it recently. -No medication changes were made today.  Needs close follow-up with PCP for routine care. Will request an appointment for 2 weeks.

## 2023-08-21 NOTE — Assessment & Plan Note (Signed)
9 mm calcified pineal gland cyst incidentally noted on recent imaging as part of ER workup in the setting of a head injury.  He was referred to neurosurgery and has an appointment scheduled for 1/20.

## 2023-08-21 NOTE — Patient Instructions (Signed)
It was a pleasure to see you today.  Thank you for giving [place] the opportunity to be involved in your care.  Below is a brief recap of your visit and next steps.  We will plan to see you again in 2 weeks.  Summary No medication changes today You may return to work with light duty (no operating heavy machinery, not lifting > 25 lbs) Follow up with PCP in 2 weeks

## 2023-08-25 NOTE — Transitions of Care (Post Inpatient/ED Visit) (Signed)
   08/25/2023  Name: LORETO LOESCHER MRN: [id number] DOB: Jan 09, 1961  Patient never returned call to completed TOC follow up call. Patient saw a provider in same office as his pcp on 08/21/2023 for issues related to recent ED visit. No further attempts to reach patient will be made.   Mathis Cashman, CMA  [facility] Team Direct Dial: [phone]

## 2023-09-08 ENCOUNTER — Telehealth: Payer: Self-pay | Admitting: Family Medicine

## 2023-09-08 ENCOUNTER — Encounter: Payer: Self-pay | Admitting: Family Medicine

## 2023-09-08 ENCOUNTER — Ambulatory Visit: Payer: BC Managed Care – PPO | Admitting: Family Medicine

## 2023-09-08 VITALS — BP 152/78 | HR 82 | Ht 68.0 in | Wt 191.0 lb

## 2023-09-08 DIAGNOSIS — I10 Essential (primary) hypertension: Secondary | ICD-10-CM

## 2023-09-08 MED ORDER — UNABLE TO FIND: 1 refills | Status: AC

## 2023-09-08 MED ORDER — TELMISARTAN 20 MG PO TABS: 20.0000 mg | ORAL_TABLET | Freq: Every day | ORAL | 1 refills | Status: AC

## 2023-09-08 NOTE — Assessment & Plan Note (Signed)
 Uncontrolled Blood Pressure in the Clinic Will initiate therapy with Telmisartan  20 mg daily and encourage the patient to continue taking Hydralazine  25 mg three times daily (TID). A blood pressure monitor has been prescribed, and the patient is encouraged to check his blood pressure before taking his medication. He should not take his blood pressure medication if his BP is less than 90/60. The patient is advised to monitor his blood pressure at least an hour after taking his antihypertensive medication, repeat readings for BP greater than 140/90, and bring his ambulatory readings to his next appointment. The patient is encouraged to report to the emergency department (ED) if his blood pressure exceeds 180/120, especially if accompanied by severe headaches unrelieved by Tylenol  or ibuprofen, chest pain, palpitations, or visual disturbances. Discussed the cardiovascular risks of uncontrolled high blood pressure, including heart failure, stroke, and coronary artery disease. A low-sodium diet of less than 2,300 mg daily is recommended, along with increased physical activity of moderate intensity, aiming for 150 minutes per week. The patient is encouraged to continue these lifestyle modifications to help manage his blood pressure effectively. A work note will be provided for light duty, advising the patient not to lift anything greater than 25 pounds until his follow-up in four weeks to reassess his blood pressure. Discussed nonpharmacological ways to manage stress. The patient verbalized understanding and is aware of the plan of care. BP Readings from Last 3 Encounters:  09/08/23 (!) 152/78  08/21/23 127/80  06/20/22 (!) 147/84

## 2023-09-08 NOTE — Progress Notes (Signed)
 Established Patient Office Visit  Subjective:  [name], male    DOB: September 21, 1960  Age: 63 y.o. MRN: [id number]  CC:  Chief Complaint  Patient presents with   Follow-up    2 wk f/up concussion, HTN    HPI Scott Perez is a 63 y.o. male with past medical history of essential hypertension, GERD, OSA, concussion without loss of consciousness presents for BP f/u.  Hypertension: The patient's blood pressure is uncontrolled today in the clinic. He reports compliance with hydralazine  25 mg three times daily.He admits to experiencing ongoing increased stress, which may be contributing to his elevated blood pressure, and also acknowledges frequent fast food consumption.He is asymptomatic in the clinic today but reports that his ambulatory blood pressure readings have been elevated.    Past Medical History:  Diagnosis Date   BPH (benign prostatic hypertrophy)    Diabetes mellitus    H. pylori infection 12/2011   treated with prevpac   History of kidney stones    Hyperlipidemia    Hypertension    Sleep apnea     Past Surgical History:  Procedure Laterality Date   COLONOSCOPY N/A 08/06/2018   Procedure: COLONOSCOPY;  Surgeon: Shaaron Lamar HERO, MD;  Location: [facility];  Service: Endoscopy;  Laterality: N/A;  8:30   cyst left wrist     left foot  2004    Family History  Problem Relation Age of Onset   Breast cancer Mother    Hypertension Father    Colon polyps Brother    Colon cancer Brother    Colon cancer Paternal Uncle     Social History   Socioeconomic History   Marital status: Married    Spouse name: Not on file   Number of children: 3   Years of education: Not on file   Highest education level: Not on file  Occupational History   Occupation: truck driver  Tobacco Use   Smoking status: Former    Current packs/day: 0.00    Average packs/day: 1 pack/day for 18.0 years (18.0 ttl pk-yrs)    Types: Cigarettes    Start date: 12/18/1977     Quit date: 12/19/1995    Years since quitting: 27.7   Smokeless tobacco: Never  Vaping Use   Vaping status: Never Used  Substance and Sexual Activity   Alcohol use: No   Drug use: No   Sexual activity: Yes  Other Topics Concern   Not on file  Social History Narrative   Not on file   Social Drivers of Health   Financial Resource Strain: Not on file  Food Insecurity: Not on file  Transportation Needs: Not on file  Physical Activity: Not on file  Stress: Not on file  Social Connections: Not on file  Intimate Partner Violence: Not on file    Outpatient Medications Prior to Visit  Medication Sig Dispense Refill   dapagliflozin  propanediol (FARXIGA ) 5 MG TABS tablet Take 1 tablet (5 mg total) by mouth daily before breakfast. 30 tablet 1   metFORMIN  (GLUCOPHAGE ) 1000 MG tablet Take 1 tablet (1,000 mg total) by mouth 2 (two) times daily with a meal. 180 tablet 1   tirzepatide  (MOUNJARO ) 5 MG/0.5ML Pen Inject 5 mg into the skin once a week. 6 mL 0   Vitamin D , Ergocalciferol , (DRISDOL ) 1.25 MG (50000 UNIT) CAPS capsule Take 1 capsule (50,000 Units total) by mouth every 7 (seven) days. 5 capsule 2   lisinopril  (ZESTRIL ) 10 MG tablet  Take 1 tablet (10 mg total) by mouth daily. 90 tablet 3   alfuzosin  (UROXATRAL ) 10 MG 24 hr tablet Take 1 tablet (10 mg total) by mouth daily. (Patient not taking: Reported on 09/08/2023) 30 tablet 11   aspirin 81 MG tablet Take 81 mg by mouth 3 (three) times a week. (Patient not taking: Reported on 09/08/2023)     cyclobenzaprine  (FLEXERIL ) 5 MG tablet Take 1 tablet (5 mg total) by mouth 3 (three) times daily as needed for muscle spasms. (Patient not taking: Reported on 09/08/2023) 30 tablet 1   gabapentin  (NEURONTIN ) 100 MG capsule Take 1 capsule (100 mg total) by mouth at bedtime. (Patient not taking: Reported on 09/08/2023) 90 capsule 3   rosuvastatin  (CRESTOR ) 10 MG tablet Take 1 tablet (10 mg total) by mouth daily. (Patient not taking: Reported on 09/08/2023) 90  tablet 3   sildenafil  (VIAGRA ) 100 MG tablet Take 1 tablet (100 mg total) by mouth as needed for erectile dysfunction. (Patient not taking: Reported on 09/08/2023) 6 tablet 0   No facility-administered medications prior to visit.    No Known Allergies  ROS Review of Systems  Constitutional:  Negative for fatigue and fever.  Eyes:  Negative for visual disturbance.  Respiratory:  Negative for chest tightness and shortness of breath.   Cardiovascular:  Negative for chest pain and palpitations.  Neurological:  Negative for dizziness and headaches.      Objective:    Physical Exam HENT:     Head: Normocephalic.     Right Ear: External ear normal.     Left Ear: External ear normal.     Nose: No congestion or rhinorrhea.     Mouth/Throat:     Mouth: Mucous membranes are moist.  Cardiovascular:     Rate and Rhythm: Regular rhythm.     Heart sounds: No murmur heard. Pulmonary:     Effort: No respiratory distress.     Breath sounds: Normal breath sounds.  Neurological:     Mental Status: He is alert.     BP (!) 152/78   Pulse 82   Ht 5' 8 (1.727 m)   Wt 191 lb (86.6 kg)   SpO2 (!) 60%   BMI 29.04 kg/m  Wt Readings from Last 3 Encounters:  09/08/23 191 lb (86.6 kg)  08/21/23 185 lb 12.8 oz (84.3 kg)  06/20/22 180 lb (81.6 kg)    Lab Results  Component Value Date   TSH 3.990 05/12/2022   Lab Results  Component Value Date   WBC 5.1 05/12/2022   HGB 16.2 05/12/2022   HCT 48.6 05/12/2022   MCV 88 05/12/2022   PLT 214 05/12/2022   Lab Results  Component Value Date   NA 130 (L) 05/12/2022   K 4.3 05/12/2022   CO2 25 05/12/2022   GLUCOSE 454 (H) 05/12/2022   BUN 13 05/12/2022   CREATININE 1.17 05/12/2022   BILITOT 0.4 05/12/2022   ALKPHOS 118 05/12/2022   AST 13 05/12/2022   ALT 15 05/12/2022   PROT 6.5 05/12/2022   ALBUMIN 4.0 05/12/2022   CALCIUM  10.6 (H) 05/12/2022   EGFR 71 05/12/2022   Lab Results  Component Value Date   CHOL 208 (H) 05/12/2022    Lab Results  Component Value Date   HDL 47 05/12/2022   Lab Results  Component Value Date   LDLCALC 131 (H) 05/12/2022   Lab Results  Component Value Date   TRIG 166 (H) 05/12/2022   Lab Results  Component  Value Date   CHOLHDL 4.4 05/12/2022   Lab Results  Component Value Date   HGBA1C >15.5 (H) 05/12/2022      Assessment & Plan:  Essential hypertension, benign Assessment & Plan: Uncontrolled Blood Pressure in the Clinic Will initiate therapy with Telmisartan  20 mg daily and encourage the patient to continue taking Hydralazine  25 mg three times daily (TID). A blood pressure monitor has been prescribed, and the patient is encouraged to check his blood pressure before taking his medication. He should not take his blood pressure medication if his BP is less than 90/60. The patient is advised to monitor his blood pressure at least an hour after taking his antihypertensive medication, repeat readings for BP greater than 140/90, and bring his ambulatory readings to his next appointment. The patient is encouraged to report to the emergency department (ED) if his blood pressure exceeds 180/120, especially if accompanied by severe headaches unrelieved by Tylenol  or ibuprofen, chest pain, palpitations, or visual disturbances. Discussed the cardiovascular risks of uncontrolled high blood pressure, including heart failure, stroke, and coronary artery disease. A low-sodium diet of less than 2,300 mg daily is recommended, along with increased physical activity of moderate intensity, aiming for 150 minutes per week. The patient is encouraged to continue these lifestyle modifications to help manage his blood pressure effectively. A work note will be provided for light duty, advising the patient not to lift anything greater than 25 pounds until his follow-up in four weeks to reassess his blood pressure. Discussed nonpharmacological ways to manage stress. The patient verbalized understanding and  is aware of the plan of care. BP Readings from Last 3 Encounters:  09/08/23 (!) 152/78  08/21/23 127/80  06/20/22 (!) 147/84     Orders: -     Telmisartan ; Take 1 tablet (20 mg total) by mouth daily.  Dispense: 30 tablet; Refill: 1 -     UNABLE TO FIND; Med Name: Blood Pressure Monitor ICD:I10  Dispense: 1 each; Refill: 1  Note: This chart has been completed using [employer] (consulting) software, and while attempts have been made to ensure accuracy, certain words and phrases may not be transcribed as intended.    Follow-up: Return in about 1 month (around 10/09/2023) for BP.   Albina Gosney, FNP

## 2023-09-08 NOTE — Patient Instructions (Addendum)
 I appreciate the opportunity to provide care to you today!    Follow up:  4 weeks for BP  Hypertension Management  Your current blood pressure is above the target goal of <140/90 mmHg. To address this, please continue taking hydralazine  25mg  TID and telmisartan  20 daily.   Medication Instructions: Take your blood pressure medication at the same time each day. After taking your medication, check your blood pressure at least an hour later. If your first reading is >140/90 mmHg, wait at least 10 minutes and recheck your blood pressure. Side Effects: In the initial days of therapy, you may experience dizziness or lightheadedness as your body adjusts to the lower blood pressure; this is expected. Diet and Lifestyle: Adhere to a low-sodium diet, limiting intake to less than 1500 mg daily, and increase your physical activity. Avoid over-the-counter NSAIDs such as ibuprofen and naproxen  while on this medication. Hydration and Nutrition: Stay well-hydrated by drinking at least 64 ounces of water  daily. Increase your servings of fruits and vegetables and avoid excessive sodium in your diet. Long-Term Considerations: Uncontrolled hypertension can increase the risk of cardiovascular diseases, including stroke, coronary artery disease, and heart failure.  Please report to the emergency department if your blood pressure exceeds 180/120 and is accompanied by symptoms such as headaches, chest pain, palpitations, blurred vision, or dizziness.    Please do not  take your BP medication if your BP is <90/60   Attached with your AVS, you will find valuable resources for self-education. I highly recommend dedicating some time to thoroughly examine them.   Please continue to a heart-healthy diet and increase your physical activities. Try to exercise for at least five days a week.    It was a pleasure to see you and I look forward to continuing to work together on your health and well-being. Please do not  hesitate to call the office if you need care or have questions about your care.  In case of emergency, please visit the Emergency Department for urgent care, or contact our clinic at [phone] to schedule an appointment. We're here to help you!   Have a wonderful day and week. With Gratitude, Shavon Ashmore MSN, FNP-BC

## 2023-09-08 NOTE — Telephone Encounter (Signed)
 Copied from CRM [id number]. Topic: Medical Record Request - Other >> Sep 08, 2023 12:27 PM Fonda Kinder J wrote: Reason for CRM: Pt is requesting a work excuse for his visit today be uploaded to my chart, he states he didn't get one while in office

## 2023-09-09 NOTE — Telephone Encounter (Signed)
 done

## 2023-09-10 ENCOUNTER — Telehealth: Payer: Self-pay | Admitting: Family Medicine

## 2023-09-10 NOTE — Telephone Encounter (Signed)
Scheduled

## 2023-09-10 NOTE — Telephone Encounter (Signed)
Per Malachi Bonds- needed 4 week follow up scheduled

## 2023-09-10 NOTE — Telephone Encounter (Signed)
Copied from CRM [id number]. Topic: Appointments - Scheduling Inquiry for Clinic >> Sep 09, 2023  2:55 PM Ivette P wrote: Reason for CRM: Pt called in to schedule appt to check Blood Pressure, was told by Dr. Denny Levy needs to be in the next 4 weeks. Next available appt isn't until March. Pt requesting sooneer appt. Callback [phone]

## 2023-10-02 DIAGNOSIS — H35033 Hypertensive retinopathy, bilateral: Secondary | ICD-10-CM | POA: Diagnosis not present

## 2023-10-13 ENCOUNTER — Ambulatory Visit: Payer: BC Managed Care – PPO | Admitting: Family Medicine

## 2023-10-15 ENCOUNTER — Ambulatory Visit: Payer: BC Managed Care – PPO | Admitting: Family Medicine

## 2023-10-19 ENCOUNTER — Ambulatory Visit: Payer: BC Managed Care – PPO | Admitting: Internal Medicine

## 2023-10-19 ENCOUNTER — Encounter: Payer: Self-pay | Admitting: Internal Medicine

## 2023-10-19 VITALS — BP 124/62 | HR 91 | Ht 68.0 in | Wt 182.2 lb

## 2023-10-19 DIAGNOSIS — E1165 Type 2 diabetes mellitus with hyperglycemia: Secondary | ICD-10-CM | POA: Diagnosis not present

## 2023-10-19 DIAGNOSIS — Z7984 Long term (current) use of oral hypoglycemic drugs: Secondary | ICD-10-CM | POA: Diagnosis not present

## 2023-10-19 DIAGNOSIS — E785 Hyperlipidemia, unspecified: Secondary | ICD-10-CM | POA: Diagnosis not present

## 2023-10-19 DIAGNOSIS — I10 Essential (primary) hypertension: Secondary | ICD-10-CM

## 2023-10-19 DIAGNOSIS — E119 Type 2 diabetes mellitus without complications: Secondary | ICD-10-CM | POA: Diagnosis not present

## 2023-10-19 MED ORDER — METFORMIN HCL 1000 MG PO TABS: 1000.0000 mg | ORAL_TABLET | Freq: Two times a day (BID) | ORAL | 0 refills | Status: AC

## 2023-10-19 MED ORDER — TIRZEPATIDE 5 MG/0.5ML ~~LOC~~ SOAJ: 5.0000 mg | SUBCUTANEOUS | 0 refills | Status: AC

## 2023-10-19 MED ORDER — ROSUVASTATIN CALCIUM 10 MG PO TABS: 10.0000 mg | ORAL_TABLET | Freq: Every day | ORAL | 3 refills | Status: AC

## 2023-10-19 NOTE — Progress Notes (Signed)
 Established Patient Office Visit  Subjective:  [name], male    DOB: August 19, 1961  Age: 63 y.o. MRN: [id number]  CC:  Chief Complaint  Patient presents with   Hypertension    F/u for hypertension.    HPI [name] is a 63 y.o. male with past medical history of HTN, uncontrolled type II DM, HLD, BPH and OSA who presents for f/u of his chronic medical conditions.  HTN: His blood pressure is WNL today.  He was placed on hydralazine by ER, and was later switched to telmisartan by her PCP.  He has brought home BP readings, which are also showing improved BP readings around 120s/70s.  He denies any headache, dizziness, chest pain, dyspnea or palpitations.  Type II DM: His last HbA1c was >15.5 in 09/23, has not had blood tests since then due to lack of follow up.  He was given metformin 1000 mg twice daily and Mounjaro 5 mg QW, but his compliance is questionable.  He states that he is using old metformin currently.  Does not check blood glucose regularly, although has glucometer and supplies.  Denies polyuria or polydipsia.  He has also stopped taking Crestor as he ran out of it.    Past Medical History:  Diagnosis Date   BPH (benign prostatic hypertrophy)    Diabetes mellitus    H. pylori infection 12/2011   treated with prevpac   History of kidney stones    Hyperlipidemia    Hypertension    Sleep apnea     Past Surgical History:  Procedure Laterality Date   COLONOSCOPY N/A 08/06/2018   Procedure: COLONOSCOPY;  Surgeon: Corbin Ade, MD;  Location: [facility];  Service: Endoscopy;  Laterality: N/A;  8:30   cyst left wrist     left foot  2004    Family History  Problem Relation Age of Onset   Breast cancer Mother    Hypertension Father    Colon polyps Brother    Colon cancer Brother    Colon cancer Paternal Uncle     Social History   Socioeconomic History   Marital status: Married    Spouse name: Not on file   Number of children: 3    Years of education: Not on file   Highest education level: Not on file  Occupational History   Occupation: truck driver  Tobacco Use   Smoking status: Former    Current packs/day: 0.00    Average packs/day: 1 pack/day for 18.0 years (18.0 ttl pk-yrs)    Types: Cigarettes    Start date: 12/18/1977    Quit date: 12/19/1995    Years since quitting: 27.8   Smokeless tobacco: Never  Vaping Use   Vaping status: Never Used  Substance and Sexual Activity   Alcohol use: No   Drug use: No   Sexual activity: Yes  Other Topics Concern   Not on file  Social History Narrative   Not on file   Social Drivers of Health   Financial Resource Strain: Not on file  Food Insecurity: Not on file  Transportation Needs: Not on file  Physical Activity: Not on file  Stress: Not on file  Social Connections: Not on file  Intimate Partner Violence: Not on file    Outpatient Medications Prior to Visit  Medication Sig Dispense Refill   aspirin 81 MG tablet Take 81 mg by mouth 3 (three) times a week.     telmisartan (MICARDIS) 20 MG  tablet Take 1 tablet (20 mg total) by mouth daily. 30 tablet 1   UNABLE TO FIND Med Name: Blood Pressure Monitor ICD:I10 1 each 1   Vitamin D, Ergocalciferol, (DRISDOL) 1.25 MG (50000 UNIT) CAPS capsule Take 1 capsule (50,000 Units total) by mouth every 7 (seven) days. 5 capsule 2   metFORMIN (GLUCOPHAGE) 1000 MG tablet Take 1 tablet (1,000 mg total) by mouth 2 (two) times daily with a meal. 180 tablet 1   tirzepatide (MOUNJARO) 5 MG/0.5ML Pen Inject 5 mg into the skin once a week. 6 mL 0   alfuzosin (UROXATRAL) 10 MG 24 hr tablet Take 1 tablet (10 mg total) by mouth daily. (Patient not taking: Reported on 09/08/2023) 30 tablet 11   cyclobenzaprine (FLEXERIL) 5 MG tablet Take 1 tablet (5 mg total) by mouth 3 (three) times daily as needed for muscle spasms. (Patient not taking: Reported on 09/08/2023) 30 tablet 1   dapagliflozin propanediol (FARXIGA) 5 MG TABS tablet Take 1 tablet  (5 mg total) by mouth daily before breakfast. 30 tablet 1   gabapentin (NEURONTIN) 100 MG capsule Take 1 capsule (100 mg total) by mouth at bedtime. (Patient not taking: Reported on 09/08/2023) 90 capsule 3   rosuvastatin (CRESTOR) 10 MG tablet Take 1 tablet (10 mg total) by mouth daily. (Patient not taking: Reported on 09/08/2023) 90 tablet 3   sildenafil (VIAGRA) 100 MG tablet Take 1 tablet (100 mg total) by mouth as needed for erectile dysfunction. (Patient not taking: Reported on 09/08/2023) 6 tablet 0   No facility-administered medications prior to visit.    No Known Allergies  ROS Review of Systems  Constitutional:  Negative for chills and fever.  HENT:  Negative for congestion and sore throat.   Eyes:  Negative for pain and discharge.  Respiratory:  Negative for cough and shortness of breath.   Cardiovascular:  Negative for chest pain and palpitations.  Gastrointestinal:  Negative for constipation, diarrhea, nausea and vomiting.  Endocrine: Negative for polydipsia and polyuria.  Genitourinary:  Negative for dysuria and hematuria.  Musculoskeletal:  Negative for neck pain and neck stiffness.  Skin:  Negative for rash.  Neurological:  Negative for dizziness, weakness, numbness and headaches.  Psychiatric/Behavioral:  Negative for agitation and behavioral problems.       Objective:    Physical Exam Vitals reviewed.  Constitutional:      General: He is not in acute distress.    Appearance: He is not diaphoretic.  HENT:     Head: Normocephalic and atraumatic.     Nose: Nose normal.     Mouth/Throat:     Mouth: Mucous membranes are moist.     Pharynx: No posterior oropharyngeal erythema.  Eyes:     General: No scleral icterus.    Extraocular Movements: Extraocular movements intact.  Cardiovascular:     Rate and Rhythm: Normal rate and regular rhythm.     Heart sounds: Normal heart sounds. No murmur heard. Pulmonary:     Breath sounds: Normal breath sounds. No wheezing or  rales.  Musculoskeletal:     Cervical back: Neck supple. No tenderness.     Right lower leg: No edema.     Left lower leg: No edema.  Skin:    General: Skin is warm.     Findings: No rash.  Neurological:     General: No focal deficit present.     Mental Status: He is alert and oriented to person, place, and time.     Sensory:  No sensory deficit.     Motor: No weakness.  Psychiatric:        Mood and Affect: Mood normal.        Behavior: Behavior normal.     BP 124/62   Pulse 91   Ht 5\' 8"  (1.727 m)   Wt 182 lb 3.2 oz (82.6 kg)   SpO2 98%   BMI 27.70 kg/m  Wt Readings from Last 3 Encounters:  10/19/23 182 lb 3.2 oz (82.6 kg)  09/08/23 191 lb (86.6 kg)  08/21/23 185 lb 12.8 oz (84.3 kg)    Lab Results  Component Value Date   TSH 3.990 05/12/2022   Lab Results  Component Value Date   WBC 5.1 05/12/2022   HGB 16.2 05/12/2022   HCT 48.6 05/12/2022   MCV 88 05/12/2022   PLT 214 05/12/2022   Lab Results  Component Value Date   NA 130 (L) 05/12/2022   K 4.3 05/12/2022   CO2 25 05/12/2022   GLUCOSE 454 (H) 05/12/2022   BUN 13 05/12/2022   CREATININE 1.17 05/12/2022   BILITOT 0.4 05/12/2022   ALKPHOS 118 05/12/2022   AST 13 05/12/2022   ALT 15 05/12/2022   PROT 6.5 05/12/2022   ALBUMIN 4.0 05/12/2022   CALCIUM 10.6 (H) 05/12/2022   EGFR 71 05/12/2022   Lab Results  Component Value Date   CHOL 208 (H) 05/12/2022   Lab Results  Component Value Date   HDL 47 05/12/2022   Lab Results  Component Value Date   LDLCALC 131 (H) 05/12/2022   Lab Results  Component Value Date   TRIG 166 (H) 05/12/2022   Lab Results  Component Value Date   CHOLHDL 4.4 05/12/2022   Lab Results  Component Value Date   HGBA1C >15.5 (H) 05/12/2022      Assessment & Plan:   Problem List Items Addressed This Visit       Cardiovascular and Mediastinum   Essential hypertension, benign - Primary   BP Readings from Last 1 Encounters:  10/19/23 124/62   Well-controlled  with telmisartan 20 mg QD now Check CMP today Counseled for compliance with the medications Advised DASH diet and moderate exercise/walking, at least 150 mins/week       Relevant Medications   rosuvastatin (CRESTOR) 10 MG tablet   Other Relevant Orders   CMP14+EGFR     Endocrine   Type 2 diabetes mellitus with hyperglycemia (HCC)   Lab Results  Component Value Date   HGBA1C >15.5 (H) 05/12/2022    Uncontrolled due to noncompliance Advised to follow diabetic diet On metformin 1000 mg twice daily and Mounjaro 5 mg QW, but noncompliant - refilled, and advised to remain compliant to medication regimen Needs to check blood glucose at least twice daily and bring the log in the next visit F/u CMP and HbA1c Diabetic eye exam: Advised to follow up with Ophthalmology for diabetic eye exam      Relevant Medications   tirzepatide (MOUNJARO) 5 MG/0.5ML Pen   metFORMIN (GLUCOPHAGE) 1000 MG tablet   rosuvastatin (CRESTOR) 10 MG tablet   Other Relevant Orders   CMP14+EGFR   Hemoglobin A1c     Other   Hyperlipidemia LDL goal <70   Refilled Crestor 10 mg once daily Advised to remain compliant to reduce risk of ASCVD      Relevant Medications   rosuvastatin (CRESTOR) 10 MG tablet    Meds ordered this encounter  Medications   tirzepatide (MOUNJARO) 5 MG/0.5ML Pen  Sig: Inject 5 mg into the skin once a week.    Dispense:  6 mL    Refill:  0   metFORMIN (GLUCOPHAGE) 1000 MG tablet    Sig: Take 1 tablet (1,000 mg total) by mouth 2 (two) times daily with a meal.    Dispense:  180 tablet    Refill:  0   rosuvastatin (CRESTOR) 10 MG tablet    Sig: Take 1 tablet (10 mg total) by mouth daily.    Dispense:  90 tablet    Refill:  3    Follow-up: Return in about 3 months (around 01/16/2024) for DM and HTN.    Anabel Halon, MD

## 2023-10-19 NOTE — Assessment & Plan Note (Signed)
 BP Readings from Last 1 Encounters:  10/19/23 124/62   Well-controlled with telmisartan 20 mg QD now Check CMP today Counseled for compliance with the medications Advised DASH diet and moderate exercise/walking, at least 150 mins/week

## 2023-10-19 NOTE — Assessment & Plan Note (Signed)
 Refilled Crestor 10 mg once daily Advised to remain compliant to reduce risk of ASCVD

## 2023-10-19 NOTE — Assessment & Plan Note (Addendum)
 Lab Results  Component Value Date   HGBA1C >15.5 (H) 05/12/2022    Uncontrolled due to noncompliance Advised to follow diabetic diet On metformin 1000 mg twice daily and Mounjaro 5 mg QW, but noncompliant - refilled, and advised to remain compliant to medication regimen Needs to check blood glucose at least twice daily and bring the log in the next visit F/u CMP and HbA1c Diabetic eye exam: Advised to follow up with Ophthalmology for diabetic eye exam

## 2023-10-19 NOTE — Patient Instructions (Signed)
 Please start taking medications as prescribed.  Please continue to follow low carb diet and perform moderate exercise/walking at least 150 mins/week.

## 2023-10-20 LAB — CMP14+EGFR
ALT: 13 [IU]/L (ref 0–44)
AST: 19 [IU]/L (ref 0–40)
Albumin: 3.7 g/dL — ABNORMAL LOW (ref 3.9–4.9)
Alkaline Phosphatase: 84 [IU]/L (ref 44–121)
BUN/Creatinine Ratio: 18 (ref 10–24)
BUN: 26 mg/dL (ref 8–27)
Bilirubin Total: 0.3 mg/dL (ref 0.0–1.2)
CO2: 22 mmol/L (ref 20–29)
Calcium: 10.4 mg/dL — ABNORMAL HIGH (ref 8.6–10.2)
Chloride: 102 mmol/L (ref 96–106)
Creatinine, Ser: 1.45 mg/dL — ABNORMAL HIGH (ref 0.76–1.27)
Globulin, Total: 2.3 g/dL (ref 1.5–4.5)
Glucose: 194 mg/dL — ABNORMAL HIGH (ref 70–99)
Potassium: 4.2 mmol/L (ref 3.5–5.2)
Sodium: 135 mmol/L (ref 134–144)
Total Protein: 6 g/dL (ref 6.0–8.5)
eGFR: 54 mL/min/{1.73_m2} — ABNORMAL LOW (ref >59)

## 2023-10-20 LAB — HEMOGLOBIN A1C
Est. average glucose Bld gHb Est-mCnc: 235 mg/dL
Hgb A1c MFr Bld: 9.8 % — ABNORMAL HIGH (ref 4.8–5.6)

## 2023-10-22 NOTE — Progress Notes (Signed)
 Thank you :)

## 2023-11-11 ENCOUNTER — Telehealth: Payer: Self-pay

## 2023-11-11 NOTE — Telephone Encounter (Signed)
 Patient was identified as falling into the [place] Measure - Diabetes.   Patient was: Appointment scheduled for lab or office visit for A1c.

## 2024-01-11 DIAGNOSIS — S0091XA Abrasion of unspecified part of head, initial encounter: Secondary | ICD-10-CM | POA: Diagnosis not present

## 2024-01-11 DIAGNOSIS — S299XXA Unspecified injury of thorax, initial encounter: Secondary | ICD-10-CM | POA: Diagnosis not present

## 2024-01-11 DIAGNOSIS — R519 Headache, unspecified: Secondary | ICD-10-CM | POA: Diagnosis not present

## 2024-01-11 DIAGNOSIS — Z87891 Personal history of nicotine dependence: Secondary | ICD-10-CM | POA: Diagnosis not present

## 2024-01-11 DIAGNOSIS — Y92410 Unspecified street and highway as the place of occurrence of the external cause: Secondary | ICD-10-CM | POA: Diagnosis not present

## 2024-01-11 DIAGNOSIS — S6992XA Unspecified injury of left wrist, hand and finger(s), initial encounter: Secondary | ICD-10-CM | POA: Diagnosis not present

## 2024-01-11 DIAGNOSIS — R030 Elevated blood-pressure reading, without diagnosis of hypertension: Secondary | ICD-10-CM | POA: Diagnosis not present

## 2024-01-19 ENCOUNTER — Ambulatory Visit: Payer: BC Managed Care – PPO | Admitting: Internal Medicine
